# Patient Record
Sex: Female | Born: 1995 | Race: White | Hispanic: No | Marital: Single | Smoking: Never smoker
Health system: Southern US, Community
[De-identification: ages and names within clinical notes are randomized; demographics above are authoritative.]

## PROBLEM LIST (undated history)

## (undated) ENCOUNTER — Ambulatory Visit (HOSPITAL_COMMUNITY): Admission: EM | Disposition: A | Discharge status: Home / Self Care | Payer: BC Managed Care – PPO

## (undated) DIAGNOSIS — N390 Urinary tract infection, site not specified: Secondary | ICD-10-CM

## (undated) HISTORY — PX: NO PAST SURGERIES: SHX2092

---

## 2020-10-19 ENCOUNTER — Ambulatory Visit (HOSPITAL_COMMUNITY)
Admission: EM | Admit: 2020-10-19 | Discharge: 2020-10-19 | Disposition: A | Discharge status: Home / Self Care | Payer: BC Managed Care – PPO

## 2020-10-19 ENCOUNTER — Encounter (HOSPITAL_COMMUNITY): Payer: Self-pay | Admitting: Emergency Medicine

## 2020-10-19 ENCOUNTER — Other Ambulatory Visit: Payer: Self-pay

## 2020-10-19 DIAGNOSIS — J020 Streptococcal pharyngitis: Secondary | ICD-10-CM | POA: Diagnosis not present

## 2020-10-19 DIAGNOSIS — J0300 Acute streptococcal tonsillitis, unspecified: Secondary | ICD-10-CM | POA: Diagnosis not present

## 2020-10-19 LAB — POCT RAPID STREP A, ED / UC: Streptococcus, Group A Screen (Direct): POSITIVE — AB

## 2020-10-19 MED ORDER — DEXAMETHASONE SODIUM PHOSPHATE 10 MG/ML IJ SOLN
10.0000 mg | Freq: Once | INTRAMUSCULAR | Status: AC
  Administered 2020-10-19: 10 mg via INTRAMUSCULAR

## 2020-10-19 MED ORDER — AMOXICILLIN-POT CLAVULANATE 875-125 MG PO TABS: 1.0000 | ORAL_TABLET | Freq: Two times a day (BID) | ORAL | 0 refills | Status: DC

## 2020-10-19 MED ORDER — DEXAMETHASONE SODIUM PHOSPHATE 10 MG/ML IJ SOLN
INTRAMUSCULAR | Status: AC
  Filled 2020-10-19: qty 1

## 2020-10-19 MED ORDER — PREDNISONE 10 MG (21) PO TBPK: ORAL_TABLET | ORAL | 0 refills | Status: DC

## 2020-10-19 MED ORDER — LIDOCAINE VISCOUS HCL 2 % MT SOLN: 15.0000 mL | OROMUCOSAL | 0 refills | Status: DC | PRN

## 2020-10-19 MED ORDER — IBUPROFEN 600 MG PO TABS: 600.0000 mg | ORAL_TABLET | Freq: Four times a day (QID) | ORAL | 0 refills | Status: DC | PRN

## 2020-10-19 NOTE — ED Provider Notes (Signed)
[facility]    CSN: [id number] Arrival date & time: 10/19/20  2202      History   Chief Complaint Chief Complaint  Patient presents with  . Adenopathy  . Sore Throat    HPI Christy Thomas is a 25 y.o. female.   Subjective:   Christy Thomas is a 25 y.o. female who presents for evaluation of a sore throat. Associated symptoms include swollen glands, difficulty swallowing and a mild runny nose. She also has increased anxiety due to the pain experienced with swallowing. Onset of symptoms was about 1 month ago and has rapidly worsened since last night. She is drinking plenty of fluids and able to eat but with great discomfort. She has not had recent close exposure to someone with proven streptococcal pharyngitis. She has taken ibuprofen and hot tea/honey/lemon that has provided short term relief in symptoms. She denies any fevers, chills, congestion, cough, nausea or vomiting.   The following portions of the patient's history were reviewed and updated as appropriate: allergies, current medications, past family history, past medical history, past social history, past surgical history and problem list.  Pertinent items noted in HPI and remainder of comprehensive ROS otherwise negative.        History reviewed. No pertinent past medical history.  There are no problems to display for this patient.   History reviewed. No pertinent surgical history.  OB History   No obstetric history on file.      Home Medications    Prior to Admission medications   Medication Sig Start Date End Date Taking? Authorizing Provider  amoxicillin-clavulanate (AUGMENTIN) 875-125 MG tablet Take 1 tablet by mouth every 12 (twelve) hours. 10/19/20  Yes Lurline Idol, FNP  ibuprofen (ADVIL) 600 MG tablet Take 1 tablet (600 mg total) by mouth every 6 (six) hours as needed. 10/19/20  Yes Lurline Idol, FNP  lidocaine (XYLOCAINE) 2 % solution Use as directed 15 mLs in the mouth or  throat every 4 (four) hours as needed (Gargle every 4 hours as needed for sore throat). 10/19/20  Yes Lurline Idol, FNP  predniSONE (STERAPRED UNI-PAK 21 TAB) 10 MG (21) TBPK tablet Take as directed 10/19/20  Yes Quamere Mussell, FNP  AUROVELA 24 FE 1-20 MG-MCG(24) tablet Take 1 tablet by mouth daily. 09/02/20   [provider]    Family History Family History  Problem Relation Age of Onset  . Lymphoma Mother     Social History Social History   Tobacco Use  . Smoking status: Never Smoker  . Smokeless tobacco: Never Used  Substance Use Topics  . Alcohol use: Yes     Allergies   Patient has no known allergies.   Review of Systems Review of Systems  Constitutional: Negative for fever.  HENT: Positive for rhinorrhea, sore throat and trouble swallowing.   Eyes: Negative.   Respiratory: Negative.   Gastrointestinal: Negative.   Skin: Negative.   Neurological: Negative.   Psychiatric/Behavioral: The patient is nervous/anxious.   All other systems reviewed and are negative.    Physical Exam Triage Vital Signs ED Triage Vitals  Enc Vitals Group     BP 10/19/20 0925 110/66     Pulse Rate 10/19/20 0925 (!) 126     Resp 10/19/20 0925 17     Temp 10/19/20 0925 99.7 F (37.6 C)     Temp Source 10/19/20 0925 Oral     SpO2 10/19/20 0925 96 %     Weight --      Height --  Head Circumference --      Peak Flow --      Pain Score 10/19/20 0922 4     Pain Loc --      Pain Edu? --      Excl. in GC? --    No data found.  Updated Vital Signs BP 110/66 (BP Location: Left Arm)   Pulse (!) 126   Temp 99.7 F (37.6 C) (Oral)   Resp 17   LMP 09/28/2020   SpO2 96%   Visual Acuity Right Eye Distance:   Left Eye Distance:   Bilateral Distance:    Right Eye Near:   Left Eye Near:    Bilateral Near:     Physical Exam Vitals reviewed.  Constitutional:      General: She is not in acute distress.    Appearance: She is well-developed. She is not  ill-appearing or toxic-appearing.  HENT:     Head: Normocephalic.     Mouth/Throat:     Lips: Pink.     Mouth: Mucous membranes are moist.     Pharynx: Uvula midline. Pharyngeal swelling present. No oropharyngeal exudate, posterior oropharyngeal erythema or uvula swelling.     Tonsils: No tonsillar exudate or tonsillar abscesses. 3+ on the right. 3+ on the left.  Eyes:     Conjunctiva/sclera: Conjunctivae normal.  Neck:     Trachea: Trachea and phonation normal.  Cardiovascular:     Rate and Rhythm: Normal rate.     Heart sounds: Normal heart sounds.  Pulmonary:     Effort: Pulmonary effort is normal.     Breath sounds: Normal breath sounds. No stridor, decreased air movement or transmitted upper airway sounds.  Musculoskeletal:     Cervical back: Full passive range of motion without pain, normal range of motion and neck supple.  Lymphadenopathy:     Cervical: Cervical adenopathy present.  Skin:    General: Skin is warm and dry.  Neurological:     General: No focal deficit present.     Mental Status: She is alert and oriented to person, place, and time.  Psychiatric:        Mood and Affect: Mood normal.        Behavior: Behavior normal.      UC Treatments / Results  Labs (all labs ordered are listed, but only abnormal results are displayed) Labs Reviewed  POCT RAPID STREP A, ED / UC - Abnormal; Notable for the following components:      Result Value   Streptococcus, Group A Screen (Direct) POSITIVE (*)    All other components within normal limits    EKG   Radiology No results found.  Procedures Procedures (including critical care time)  Medications Ordered in UC Medications  dexamethasone (DECADRON) injection 10 mg (10 mg Intramuscular Given 10/19/20 0954)    Initial Impression / Assessment and Plan / UC Course  I have reviewed the triage vital signs and the nursing notes.  Pertinent labs & imaging results that were available during my care of the patient  were reviewed by me and considered in my medical decision making (see chart for details).    25 year old female presenting with sore throat, swollen glands, difficulty swallowing and increased anxiety due to these symptoms. Symptoms have been ongoing for the past month or so but worsening over the past 24 hours.  Patient is alert and oriented x3.  BP stable.  She is slightly tachycardic due to her anxiety.  Low-grade fever noted.  Physical  exam shows enlarged tonsils with tender and swollen lymph nodes.  Patient airway is intact.  She is able to maintain  oral secretions and speak in complete sentences.  No indications of acute distress noted.  Rapid strep positive. Will treat for both strep pharyngitis and tonsillitis.  Patient received IM Decadron in the clinic.   Plan:  Patient placed on antibiotics and steroids  Use of OTC analgesics recommended, salt water gargles and other somatic measures discussed Patient advised that he will be infectious for 24 hours after starting antibiotics. Change toothbrush after 48 hours no antibiotic Follow up as needed.  Today's evaluation has revealed no signs of a dangerous process. Discussed diagnosis with patient and/or guardian. Patient and/or guardian aware of their diagnosis, possible red flag symptoms to watch out for and need for close follow up. Patient and/or guardian understands verbal and written discharge instructions. Patient and/or guardian comfortable with plan and disposition.  Patient and/or guardian has a clear mental status at this time, good insight into illness (after discussion and teaching) and has clear judgment to make decisions regarding their care  This care was provided during an unprecedented National Emergency due to the Novel Coronavirus (COVID-19) pandemic. COVID-19 infections and transmission risks place heavy strains on healthcare resources.  As this pandemic evolves, our facility, providers, and staff strive to respond fluidly, to  remain operational, and to provide care relative to available resources and information. Outcomes are unpredictable and treatments are without well-defined guidelines. Further, the impact of COVID-19 on all aspects of urgent care, including the impact to patients seeking care for reasons other than COVID-19, is unavoidable during this national emergency. At this time of the global pandemic, management of patients has significantly changed, even for non-COVID positive patients given high local and regional COVID volumes at this time requiring high healthcare system and resource utilization. The standard of care for management of both COVID suspected and non-COVID suspected patients continues to change rapidly at the local, regional, national, and global levels. This patient was worked up and treated to the best available but ever changing evidence and resources available at this current time.   Documentation was completed with the aid of voice recognition software. Transcription may contain typographical errors.  Final Clinical Impressions(s) / UC Diagnoses   Final diagnoses:  Strep pharyngitis  Acute non-recurrent streptococcal tonsillitis     Discharge Instructions     . Take medications as prescribed . You may use the viscous lidocaine to gargle every 4 hours as needed for sore throat . Continue drinking warm liquids and eat soft foods until your throat starts to feel better . You may also take the Advil as needed for pain . Should change her toothbrush on Friday  I hope you feel better.  Take care. [facility]     ED Prescriptions    Medication Sig Dispense Auth. Provider   amoxicillin-clavulanate (AUGMENTIN) 875-125 MG tablet Take 1 tablet by mouth every 12 (twelve) hours. 14 tablet Marian Meneely, [place], FNP   predniSONE (STERAPRED UNI-PAK 21 TAB) 10 MG (21) TBPK tablet Take as directed 21 tablet Lorilyn Laitinen, FNP   lidocaine (XYLOCAINE) 2 % solution Use as directed 15 mLs in the  mouth or throat every 4 (four) hours as needed (Gargle every 4 hours as needed for sore throat). 120 mL Lurline IdolMurrill, Japneet Staggs, FNP   ibuprofen (ADVIL) 600 MG tablet Take 1 tablet (600 mg total) by mouth every 6 (six) hours as needed. 30 tablet Lurline IdolMurrill, Erie Radu, [place]NP  PDMP not reviewed this encounter.   Lurline Idol, [place] 10/19/20 1012

## 2020-10-19 NOTE — ED Triage Notes (Signed)
Pt presents with swollen lymph nodes xs 1 month. States last night swelling and pain was severe.   States has hx since child.

## 2020-10-19 NOTE — Discharge Instructions (Addendum)
Take medications as prescribed You may use the viscous lidocaine to gargle every 4 hours as needed for sore throat Continue drinking warm liquids and eat soft foods until your throat starts to feel better You may also take the Advil as needed for pain Should change her toothbrush on Friday  I hope you feel better.  Take care. Lelon Mast

## 2020-10-29 ENCOUNTER — Ambulatory Visit (HOSPITAL_COMMUNITY)
Admission: EM | Admit: 2020-10-29 | Discharge: 2020-10-29 | Disposition: A | Discharge status: Home / Self Care | Payer: BC Managed Care – PPO | Attending: Physician Assistant | Admitting: Physician Assistant

## 2020-10-29 ENCOUNTER — Encounter (HOSPITAL_COMMUNITY): Payer: Self-pay

## 2020-10-29 ENCOUNTER — Other Ambulatory Visit: Payer: Self-pay

## 2020-10-29 DIAGNOSIS — Z113 Encounter for screening for infections with a predominantly sexual mode of transmission: Secondary | ICD-10-CM | POA: Diagnosis not present

## 2020-10-29 DIAGNOSIS — N898 Other specified noninflammatory disorders of vagina: Secondary | ICD-10-CM | POA: Diagnosis present

## 2020-10-29 DIAGNOSIS — B373 Candidiasis of vulva and vagina: Secondary | ICD-10-CM | POA: Diagnosis not present

## 2020-10-29 DIAGNOSIS — N941 Unspecified dyspareunia: Secondary | ICD-10-CM | POA: Insufficient documentation

## 2020-10-29 DIAGNOSIS — B3731 Acute candidiasis of vulva and vagina: Secondary | ICD-10-CM

## 2020-10-29 MED ORDER — FLUCONAZOLE 150 MG PO TABS: ORAL_TABLET | ORAL | 0 refills | Status: DC

## 2020-10-29 NOTE — ED Triage Notes (Signed)
Pt in with c/o pain during intercourse, vaginal itching, irritation and dryness that she noticed 1 week ago and has gotten worse

## 2020-10-29 NOTE — Discharge Instructions (Signed)
We will be in touch with your results and contact you if we need to treat you for anything else.  I suspect you have a yeast infection and have called in Diflucan for you to take every 3 days for up to 3 doses.  Wear loosefitting cotton underwear.  Use hypoallergenic soaps and detergents.  If you have persistent or worsening symptoms please return for further evaluation.

## 2020-10-29 NOTE — ED Provider Notes (Signed)
[facility]    CSN: [id number] Arrival date & time: 10/29/20  1733      History   Chief Complaint Chief Complaint  Patient presents with  . Pain during intercourse  . vaginal irritation  . Vaginal Itching    HPI Christy Thomas is a 25 y.o. female.   Patient presents today with a 1 week history of dyspareunia.  She reports increased vaginal dryness and irritation.  She denies any vaginal discharge, vaginal pain, abnormal bleeding.  She has not tried any over-the-counter medications for symptom management.  She has no concern for STI but is interested in testing today.  She denies any changes to personal hygiene products including soaps, detergents, tampons, sanitary napkins.  She was seen approximately 1 week ago and treated for strep pharyngitis with antibiotics.  Reports symptoms began after antibiotic use.     History reviewed. No pertinent past medical history.  There are no problems to display for this patient.   History reviewed. No pertinent surgical history.  OB History   No obstetric history on file.      Home Medications    Prior to Admission medications   Medication Sig Start Date End Date Taking? Authorizing Provider  fluconazole (DIFLUCAN) 150 MG tablet Take 1 tablet every 72 hours for total of 3 doses or until symptoms resolve. 10/29/20  Yes Aedin Jeansonne K, PA-C  amoxicillin-clavulanate (AUGMENTIN) 875-125 MG tablet Take 1 tablet by mouth every 12 (twelve) hours. 10/19/20   Lurline Idol, FNP  AUROVELA 24 FE 1-20 MG-MCG(24) tablet Take 1 tablet by mouth daily. 09/02/20   [provider]  ibuprofen (ADVIL) 600 MG tablet Take 1 tablet (600 mg total) by mouth every 6 (six) hours as needed. 10/19/20   Lurline Idol, FNP  lidocaine (XYLOCAINE) 2 % solution Use as directed 15 mLs in the mouth or throat every 4 (four) hours as needed (Gargle every 4 hours as needed for sore throat). 10/19/20   Lurline Idol, FNP  predniSONE (STERAPRED  UNI-PAK 21 TAB) 10 MG (21) TBPK tablet Take as directed 10/19/20   Lurline Idol, FNP    Family History Family History  Problem Relation Age of Onset  . Lymphoma Mother     Social History Social History   Tobacco Use  . Smoking status: Never Smoker  . Smokeless tobacco: Never Used  Substance Use Topics  . Alcohol use: Yes     Allergies   Patient has no known allergies.   Review of Systems Review of Systems  Constitutional: Negative for activity change, appetite change, fatigue and fever.  Respiratory: Negative for cough and shortness of breath.   Gastrointestinal: Negative for abdominal pain, diarrhea, nausea and vomiting.  Genitourinary: Positive for dyspareunia. Negative for dysuria, genital sores, menstrual problem, pelvic pain, urgency, vaginal bleeding, vaginal discharge and vaginal pain.  Neurological: Negative for dizziness, light-headedness and headaches.     Physical Exam Triage Vital Signs ED Triage Vitals  Enc Vitals Group     BP 10/29/20 1756 126/81     Pulse Rate 10/29/20 1756 87     Resp 10/29/20 1756 18     Temp 10/29/20 1756 99.1 F (37.3 C)     Temp src --      SpO2 10/29/20 1756 97 %     Weight --      Height --      Head Circumference --      Peak Flow --      Pain Score 10/29/20 1755  0     Pain Loc --      Pain Edu? --      Excl. in GC? --    No data found.  Updated Vital Signs BP 126/81   Pulse 87   Temp 99.1 F (37.3 C)   Resp 18   LMP 10/21/2020 (Exact Date)   SpO2 97%   Visual Acuity Right Eye Distance:   Left Eye Distance:   Bilateral Distance:    Right Eye Near:   Left Eye Near:    Bilateral Near:     Physical Exam Vitals reviewed. Exam conducted with a chaperone present.  Constitutional:      General: She is awake. She is not in acute distress.    Appearance: Normal appearance. She is not ill-appearing.     Comments: Very pleasant female appears stated age in no acute distress  HENT:     Head: Normocephalic  and atraumatic.  Cardiovascular:     Rate and Rhythm: Normal rate and regular rhythm.     Heart sounds: No murmur heard.   Pulmonary:     Effort: Pulmonary effort is normal.     Breath sounds: Normal breath sounds. No wheezing, rhonchi or rales.     Comments: Clear to auscultation bilaterally Abdominal:     General: Bowel sounds are normal.     Palpations: Abdomen is soft.     Tenderness: There is no abdominal tenderness. There is no right CVA tenderness, left CVA tenderness, guarding or rebound.     Comments: Benign abdominal exam  Genitourinary:    Labia:        Right: Rash present. No tenderness.        Left: Rash present. No tenderness.      Vagina: Vaginal discharge present. No erythema.     Cervix: Normal.     Adnexa: Right adnexa normal and left adnexa normal.     Comments: Thick white discharge noted posterior vaginal vault.  No CMT tenderness on exam.  RN present as chaperone during exam. Psychiatric:        Behavior: Behavior is cooperative.      UC Treatments / Results  Labs (all labs ordered are listed, but only abnormal results are displayed) Labs Reviewed  CERVICOVAGINAL ANCILLARY ONLY    EKG   Radiology No results found.  Procedures Procedures (including critical care time)  Medications Ordered in UC Medications - No data to display  Initial Impression / Assessment and Plan / UC Course  I have reviewed the triage vital signs and the nursing notes.  Pertinent labs & imaging results that were available during my care of the patient were reviewed by me and considered in my medical decision making (see chart for details).     Physical exam consistent with vaginal yeast infection.  Patient was prescribed Diflucan to be used every 3 days for a total of up to 3 doses or until symptoms resolve.  Recommended cotton loosefitting underwear.  She is to use hypoallergenic soaps and detergents.  Strict return precautions given to which patient expressed  understanding.  Final Clinical Impressions(s) / UC Diagnoses   Final diagnoses:  Vaginal yeast infection     Discharge Instructions     We will be in touch with your results and contact you if we need to treat you for anything else.  I suspect you have a yeast infection and have called in Diflucan for you to take every 3 days for up to 3 doses.  Wear loosefitting cotton underwear.  Use hypoallergenic soaps and detergents.  If you have persistent or worsening symptoms please return for further evaluation.    ED Prescriptions    Medication Sig Dispense Auth. Provider   fluconazole (DIFLUCAN) 150 MG tablet Take 1 tablet every 72 hours for total of 3 doses or until symptoms resolve. 3 tablet Pennye Beeghly, Noberto Retort, PA-C     PDMP not reviewed this encounter.   Jeani Hawking, PA-C 10/29/20 1829

## 2020-11-01 LAB — CERVICOVAGINAL ANCILLARY ONLY
Bacterial Vaginitis (gardnerella): POSITIVE — AB
Candida Glabrata: NEGATIVE
Candida Vaginitis: POSITIVE — AB
Chlamydia: NEGATIVE
Comment: NEGATIVE
Comment: NEGATIVE
Comment: NEGATIVE
Comment: NEGATIVE
Comment: NEGATIVE
Comment: NORMAL
Neisseria Gonorrhea: NEGATIVE
Trichomonas: NEGATIVE

## 2020-11-03 ENCOUNTER — Telehealth (HOSPITAL_COMMUNITY): Payer: Self-pay | Admitting: Emergency Medicine

## 2020-11-03 MED ORDER — METRONIDAZOLE 500 MG PO TABS
500.0000 mg | ORAL_TABLET | Freq: Two times a day (BID) | ORAL | 0 refills | Status: DC
Start: 2020-11-03 — End: 2021-09-25

## 2020-11-04 ENCOUNTER — Telehealth (HOSPITAL_COMMUNITY): Payer: Self-pay | Admitting: Emergency Medicine

## 2020-12-13 ENCOUNTER — Ambulatory Visit (HOSPITAL_COMMUNITY)
Admission: EM | Admit: 2020-12-13 | Discharge: 2020-12-13 | Discharge status: Against Medical Advice | Payer: BC Managed Care – PPO | Attending: Internal Medicine | Admitting: Internal Medicine

## 2020-12-13 NOTE — ED Notes (Signed)
Pt left before being seen

## 2021-01-16 ENCOUNTER — Emergency Department (HOSPITAL_COMMUNITY)
Admission: EM | Admit: 2021-01-16 | Discharge: 2021-01-16 | Disposition: A | Discharge status: Home / Self Care | Payer: BC Managed Care – PPO | Attending: Emergency Medicine | Admitting: Emergency Medicine

## 2021-01-16 ENCOUNTER — Other Ambulatory Visit: Payer: Self-pay

## 2021-01-16 ENCOUNTER — Encounter (HOSPITAL_COMMUNITY): Payer: Self-pay | Admitting: Emergency Medicine

## 2021-01-16 DIAGNOSIS — R112 Nausea with vomiting, unspecified: Secondary | ICD-10-CM | POA: Diagnosis not present

## 2021-01-16 DIAGNOSIS — R1010 Upper abdominal pain, unspecified: Secondary | ICD-10-CM | POA: Diagnosis not present

## 2021-01-16 DIAGNOSIS — R1013 Epigastric pain: Secondary | ICD-10-CM | POA: Diagnosis not present

## 2021-01-16 LAB — COMPREHENSIVE METABOLIC PANEL WITH GFR
ALT: 27 U/L (ref 0–44)
AST: 30 U/L (ref 15–41)
Albumin: 4.5 g/dL (ref 3.5–5.0)
Alkaline Phosphatase: 56 U/L (ref 38–126)
Anion gap: 7 (ref 5–15)
BUN: 11 mg/dL (ref 6–20)
CO2: 25 mmol/L (ref 22–32)
Calcium: 9.4 mg/dL (ref 8.9–10.3)
Chloride: 105 mmol/L (ref 98–111)
Creatinine, Ser: 0.71 mg/dL (ref 0.44–1.00)
GFR, Estimated: >60 mL/min
Glucose, Bld: 90 mg/dL (ref 70–99)
Potassium: 4 mmol/L (ref 3.5–5.1)
Sodium: 137 mmol/L (ref 135–145)
Total Bilirubin: 0.4 mg/dL (ref 0.3–1.2)
Total Protein: 8.1 g/dL (ref 6.5–8.1)

## 2021-01-16 LAB — CBC WITH DIFFERENTIAL/PLATELET
Abs Immature Granulocytes: 0.04 K/uL (ref 0.00–0.07)
Basophils Absolute: 0 K/uL (ref 0.0–0.1)
Basophils Relative: 1 %
Eosinophils Absolute: 0 K/uL (ref 0.0–0.5)
Eosinophils Relative: 0 %
HCT: 42.1 % (ref 36.0–46.0)
Hemoglobin: 13.9 g/dL (ref 12.0–15.0)
Immature Granulocytes: 1 %
Lymphocytes Relative: 10 %
Lymphs Abs: 0.7 K/uL (ref 0.7–4.0)
MCH: 28.8 pg (ref 26.0–34.0)
MCHC: 33 g/dL (ref 30.0–36.0)
MCV: 87.2 fL (ref 80.0–100.0)
Monocytes Absolute: 0.2 K/uL (ref 0.1–1.0)
Monocytes Relative: 3 %
Neutro Abs: 6.3 K/uL (ref 1.7–7.7)
Neutrophils Relative %: 85 %
Platelets: 337 K/uL (ref 150–400)
RBC: 4.83 MIL/uL (ref 3.87–5.11)
RDW: 12.9 % (ref 11.5–15.5)
WBC: 7.4 K/uL (ref 4.0–10.5)
nRBC: 0 % (ref 0.0–0.2)

## 2021-01-16 LAB — MAGNESIUM: Magnesium: 2.1 mg/dL (ref 1.7–2.4)

## 2021-01-16 LAB — I-STAT BETA HCG BLOOD, ED (MC, WL, AP ONLY): I-stat hCG, quantitative: <5 m[IU]/mL

## 2021-01-16 LAB — LIPASE, BLOOD: Lipase: 24 U/L (ref 11–51)

## 2021-01-16 MED ORDER — ONDANSETRON HCL 4 MG/2ML IJ SOLN
4.0000 mg | Freq: Once | INTRAMUSCULAR | Status: AC
  Administered 2021-01-16: 4 mg via INTRAVENOUS
  Filled 2021-01-16: qty 2

## 2021-01-16 MED ORDER — SODIUM CHLORIDE 0.9 % IV BOLUS
1000.0000 mL | Freq: Once | INTRAVENOUS | Status: AC
  Administered 2021-01-16: 1000 mL via INTRAVENOUS

## 2021-01-16 MED ORDER — ONDANSETRON 4 MG PO TBDP: 4.0000 mg | ORAL_TABLET | Freq: Three times a day (TID) | ORAL | 0 refills | Status: DC | PRN

## 2021-01-16 NOTE — Discharge Instructions (Addendum)
I am prescribing you a medication called Zofran.  This is a disintegrating tablet you can use up to 3 times a day for management of your nausea and vomiting.  Please only take this as prescribed.  Please only take this if you are experiencing nausea and vomiting that you cannot control.  Please come back to the emergency department if you develop any new or worsening symptoms.  It was a pleasure to meet you.

## 2021-01-16 NOTE — ED Triage Notes (Signed)
Patient states she drank too much last night and she thinks she has alcohol poisoning. Has 1.5 glasses or red wine and ' quite a few' shots of tequila. Has been vomiting ever since.

## 2021-01-16 NOTE — ED Provider Notes (Signed)
[facility]-EMERGENCY DEPT Provider Note   CSN: [id number] Arrival date & time: 01/16/21  2979     History No chief complaint on file.   Christy Thomas is a 25 y.o. female.  HPI Patient is a 25 year old female with no pertinent medical history presents to the emergency department due to intractable nausea and vomiting.  Patient states that she typically does not drink alcohol but last night had 1.5 glasses of red wine and about 3-4 shots of tequila.  Since then she has been experiencing intractable nausea/vomiting that she states is clear.  Reports mild upper abdominal pain.  No chest pain or shortness of breath.  No other complaints.    History reviewed. No pertinent past medical history.  There are no problems to display for this patient.   History reviewed. No pertinent surgical history.   OB History   No obstetric history on file.     Family History  Problem Relation Age of Onset   Lymphoma Mother     Social History   Tobacco Use   Smoking status: Never   Smokeless tobacco: Never  Substance Use Topics   Alcohol use: Yes    Home Medications Prior to Admission medications   Medication Sig Start Date End Date Taking? Authorizing Provider  ondansetron (ZOFRAN ODT) 4 MG disintegrating tablet Take 1 tablet (4 mg total) by mouth every 8 (eight) hours as needed for nausea or vomiting. 01/16/21  Yes Placido Sou, PA-C  amoxicillin-clavulanate (AUGMENTIN) 875-125 MG tablet Take 1 tablet by mouth every 12 (twelve) hours. 10/19/20   Lurline Idol, FNP  AUROVELA 24 FE 1-20 MG-MCG(24) tablet Take 1 tablet by mouth daily. 09/02/20   [provider]  fluconazole (DIFLUCAN) 150 MG tablet Take 1 tablet every 72 hours for total of 3 doses or until symptoms resolve. 10/29/20   Raspet, Noberto Retort, PA-C  ibuprofen (ADVIL) 600 MG tablet Take 1 tablet (600 mg total) by mouth every 6 (six) hours as needed. 10/19/20   Lurline Idol, FNP  lidocaine  (XYLOCAINE) 2 % solution Use as directed 15 mLs in the mouth or throat every 4 (four) hours as needed (Gargle every 4 hours as needed for sore throat). 10/19/20   Lurline Idol, FNP  metroNIDAZOLE (FLAGYL) 500 MG tablet Take 1 tablet (500 mg total) by mouth 2 (two) times daily. 11/03/20   Merrilee Jansky, MD  predniSONE (STERAPRED UNI-PAK 21 TAB) 10 MG (21) TBPK tablet Take as directed 10/19/20   Lurline Idol, FNP    Allergies    Patient has no known allergies.  Review of Systems   Review of Systems  Constitutional:  Negative for fever.  Respiratory:  Negative for shortness of breath.   Cardiovascular:  Negative for chest pain.  Gastrointestinal:  Positive for abdominal pain, nausea and vomiting.   Physical Exam Updated Vital Signs BP 104/62 (BP Location: Left Arm)   Pulse 85   Temp 98.5 F (36.9 C) (Oral)   Resp 16   Ht 5\' 1"  (1.549 m)   Wt 63.5 kg   LMP 01/15/2021   SpO2 99%   BMI 26.45 kg/m   Physical Exam Vitals and nursing note reviewed.  Constitutional:      General: She is not in acute distress.    Appearance: Normal appearance. She is normal weight. She is not ill-appearing, toxic-appearing or diaphoretic.  HENT:     Head: Normocephalic and atraumatic.     Right Ear: External ear normal.  Left Ear: External ear normal.     Nose: Nose normal.     Mouth/Throat:     Mouth: Mucous membranes are moist.     Pharynx: Oropharynx is clear. No oropharyngeal exudate or posterior oropharyngeal erythema.  Eyes:     Extraocular Movements: Extraocular movements intact.  Cardiovascular:     Rate and Rhythm: Normal rate and regular rhythm.     Pulses: Normal pulses.     Heart sounds: Normal heart sounds. No murmur heard.   No friction rub. No gallop.  Pulmonary:     Effort: Pulmonary effort is normal. No respiratory distress.     Breath sounds: Normal breath sounds. No stridor. No wheezing, rhonchi or rales.  Abdominal:     General: Abdomen is flat.      Palpations: Abdomen is soft.     Tenderness: There is abdominal tenderness.     Comments: Abdomen is flat and soft.  Very mild tenderness noted along the epigastrium.  Musculoskeletal:        General: Normal range of motion.     Cervical back: Normal range of motion and neck supple. No tenderness.  Skin:    General: Skin is warm and dry.  Neurological:     General: No focal deficit present.     Mental Status: She is alert and oriented to person, place, and time.  Psychiatric:        Mood and Affect: Mood normal.        Behavior: Behavior normal.   ED Results / Procedures / Treatments   Labs (all labs ordered are listed, but only abnormal results are displayed) Labs Reviewed  COMPREHENSIVE METABOLIC PANEL  CBC WITH DIFFERENTIAL/PLATELET  LIPASE, BLOOD  MAGNESIUM  I-STAT BETA HCG BLOOD, ED ([facility], [facility], [facility] ONLY)    EKG None  Radiology No results found.  Procedures Procedures   Medications Ordered in ED Medications  sodium chloride 0.9 % bolus 1,000 mL (1,000 mLs Intravenous New Bag/Given 01/16/21 1133)  ondansetron (ZOFRAN) injection 4 mg (4 mg Intravenous Given 01/16/21 1133)    ED Course  I have reviewed the triage vital signs and the nursing notes.  Pertinent labs & imaging results that were available during my care of the patient were reviewed by me and considered in my medical decision making (see chart for details).    MDM Rules/Calculators/A&P                          Pt is a 25 y.o. female who presents to the ED due to intractable n/v after drinking ETOH last night.  Labs: CBC, CMP, lipase, magnesium, i-STAT beta-hCG all within normal limits.  I, Placido Sou, PA-C, personally reviewed and evaluated these images and lab results as part of my medical decision-making.  Patient reassessed and feeling better after Zofran as well as IV fluids.  Lab work is all reassuring.  P.o. challenged successfully.  Feel the patient is stable for discharge at this time and  she is agreeable.  Discussed moderation of her alcohol use in the future.  Will discharge with an additional course of Zofran for use as needed.  Discussed return precautions.  Her questions were answered and she was amicable at the time of discharge.  Note: Portions of this report may have been transcribed using voice recognition software. Every effort was made to ensure accuracy; however, inadvertent computerized transcription errors may be present.   Final Clinical Impression(s) / ED Diagnoses Final  diagnoses:  Non-intractable vomiting with nausea, unspecified vomiting type    Rx / DC Orders ED Discharge Orders          Ordered    ondansetron (ZOFRAN ODT) 4 MG disintegrating tablet  Every 8 hours PRN        01/16/21 1242             Placido Sou, PA-C 01/16/21 1244    Bethann Berkshire, MD 01/16/21 [phone]

## 2021-08-26 ENCOUNTER — Ambulatory Visit (HOSPITAL_COMMUNITY)
Admission: EM | Admit: 2021-08-26 | Discharge: 2021-08-26 | Disposition: A | Discharge status: Home / Self Care | Payer: BC Managed Care – PPO | Attending: Urgent Care | Admitting: Urgent Care

## 2021-08-26 ENCOUNTER — Encounter (HOSPITAL_COMMUNITY): Payer: Self-pay

## 2021-08-26 DIAGNOSIS — J039 Acute tonsillitis, unspecified: Secondary | ICD-10-CM | POA: Diagnosis not present

## 2021-08-26 DIAGNOSIS — Z202 Contact with and (suspected) exposure to infections with a predominantly sexual mode of transmission: Secondary | ICD-10-CM | POA: Diagnosis not present

## 2021-08-26 LAB — POCT INFECTIOUS MONO SCREEN, ED / UC: Mono Screen: NEGATIVE

## 2021-08-26 LAB — POCT RAPID STREP A, ED / UC: Streptococcus, Group A Screen (Direct): NEGATIVE

## 2021-08-26 MED ORDER — AZITHROMYCIN 250 MG PO TABS: 250.0000 mg | ORAL_TABLET | Freq: Every day | ORAL | 0 refills | Status: DC

## 2021-08-26 NOTE — ED Triage Notes (Signed)
Pt reports sore throat and swollen glands x 2 days, She report giving oral sex to one of her partners who now has tested positive for chlamydia. She is concerned she may have chlamydia in her mouth.

## 2021-08-26 NOTE — Discharge Instructions (Addendum)
Your rapid strep was negative, however clinically you meet criteria for treatment. Your mono test was also negative. We will send out a strep culture as well. We will call with the results of the gonorrhea and chlamydia swab once received. Start treatment with azithromycin today - take two pills today, followed by one pill each day thereafter until gone. Throw away your toothbrush on day three of antibiotics and get a new one.

## 2021-08-26 NOTE — ED Provider Notes (Signed)
[facility]    CSN: [id number] Arrival date & time: 08/26/21  1101      History   Chief Complaint Chief Complaint  Patient presents with   Sore Throat    HPI Christy Thomas is a 26 y.o. female.   Pleasant 26 year old female presents today with concerns of a sore throat.  She states for the past 2 days she has had severe pain and swelling, fever of 101.2, and swollen lymph nodes.  She states her most recent partner just notified her that he tested positive for chlamydia, however her last sexual encounter with him was 1.5 months ago.  She does state however, that her throat has "not felt right since".  Since having oral sex with this partner, she states she has felt like "something has been stuck in her throat", but the pain with swallowing and fever did not start until 2 days ago.  She denies any pelvic symptoms, denies any pain, dysuria, discharge.  She denies any rash.  She took over-the-counter Tylenol and ibuprofen for symptoms, apart from that no other over-the-counter's. She denies dysphagia, headache, rash or abdominal symptoms.   Sore Throat   History reviewed. No pertinent past medical history.  There are no problems to display for this patient.   History reviewed. No pertinent surgical history.  OB History   No obstetric history on file.      Home Medications    Prior to Admission medications   Medication Sig Start Date End Date Taking? Authorizing Provider  azithromycin (ZITHROMAX) 250 MG tablet Take 1 tablet (250 mg total) by mouth daily. Take first 2 tablets together, then 1 every day until finished. 08/26/21  Yes Besse Miron L, PA  AUROVELA 24 FE 1-20 MG-MCG(24) tablet Take 1 tablet by mouth daily. 09/02/20   [provider]  fluconazole (DIFLUCAN) 150 MG tablet Take 1 tablet every 72 hours for total of 3 doses or until symptoms resolve. 10/29/20   Raspet, Noberto Retort, PA-C  ibuprofen (ADVIL) 600 MG tablet Take 1 tablet (600 mg total) by mouth  every 6 (six) hours as needed. 10/19/20   Lurline Idol, FNP  lidocaine (XYLOCAINE) 2 % solution Use as directed 15 mLs in the mouth or throat every 4 (four) hours as needed (Gargle every 4 hours as needed for sore throat). 10/19/20   Lurline Idol, FNP  metroNIDAZOLE (FLAGYL) 500 MG tablet Take 1 tablet (500 mg total) by mouth 2 (two) times daily. 11/03/20   Merrilee Jansky, MD  ondansetron (ZOFRAN ODT) 4 MG disintegrating tablet Take 1 tablet (4 mg total) by mouth every 8 (eight) hours as needed for nausea or vomiting. 01/16/21   Placido Sou, PA-C    Family History Family History  Problem Relation Age of Onset   Lymphoma Mother     Social History Social History   Tobacco Use   Smoking status: Never   Smokeless tobacco: Never  Substance Use Topics   Alcohol use: Yes     Allergies   Patient has no known allergies.   Review of Systems Review of Systems  Constitutional:  Positive for fever.  HENT:  Positive for sore throat.   Hematological:  Positive for adenopathy.  All other systems reviewed and are negative.   Physical Exam Triage Vital Signs ED Triage Vitals  Enc Vitals Group     BP 08/26/21 1146 116/77     Pulse --      Resp 08/26/21 1146 16     Temp  08/26/21 1150 99 F (37.2 C)     Temp src --      SpO2 08/26/21 1146 100 %     Weight --      Height --      Head Circumference --      Peak Flow --      Pain Score 08/26/21 1147 7     Pain Loc --      Pain Edu? --      Excl. in GC? --    No data found.  Updated Vital Signs BP 116/77 (BP Location: Left Arm)    Temp 99 F (37.2 C)    Resp 16    LMP 08/26/2021 (Approximate)    SpO2 100%   Visual Acuity Right Eye Distance:   Left Eye Distance:   Bilateral Distance:    Right Eye Near:   Left Eye Near:    Bilateral Near:     Physical Exam Vitals and nursing note reviewed.  Constitutional:      General: She is not in acute distress.    Appearance: She is well-developed and normal weight.  She is ill-appearing. She is not toxic-appearing or diaphoretic.  HENT:     Head: Normocephalic and atraumatic.     Right Ear: Tympanic membrane and ear canal normal. No drainage, swelling or tenderness. No middle ear effusion. Tympanic membrane is not erythematous.     Left Ear: Tympanic membrane and ear canal normal. No drainage, swelling or tenderness.  No middle ear effusion. Tympanic membrane is not erythematous.     Nose: No congestion or rhinorrhea.     Mouth/Throat:     Mouth: Mucous membranes are moist.     Pharynx: Oropharyngeal exudate, posterior oropharyngeal erythema and uvula swelling present. No pharyngeal swelling.     Tonsils: Tonsillar exudate present. No tonsillar abscesses. 3+ on the right. 3+ on the left.     Comments: Petechiae to soft palate midline Eyes:     Extraocular Movements:     Right eye: Normal extraocular motion.     Left eye: Normal extraocular motion.     Conjunctiva/sclera: Conjunctivae normal.     Pupils: Pupils are equal, round, and reactive to light.  Neck:     Thyroid: No thyromegaly.  Cardiovascular:     Rate and Rhythm: Regular rhythm. Tachycardia present.     Heart sounds: Normal heart sounds. No murmur heard.   No friction rub. No gallop.  Pulmonary:     Effort: Pulmonary effort is normal. No respiratory distress.     Breath sounds: Normal breath sounds. No stridor. No wheezing, rhonchi or rales.  Chest:     Chest wall: No tenderness.  Abdominal:     General: There is no distension.     Palpations: Abdomen is soft.     Tenderness: There is no abdominal tenderness. There is no rebound.  Musculoskeletal:     Cervical back: Normal range of motion and neck supple.  Lymphadenopathy:     Cervical: No cervical adenopathy (bilateral submandibular, left anterior cervical).  Skin:    General: Skin is warm.     Capillary Refill: Capillary refill takes less than 2 seconds.  Neurological:     General: No focal deficit present.     Mental Status:  She is alert and oriented to person, place, and time.  Psychiatric:        Mood and Affect: Mood normal.     UC Treatments / Results  Labs (all labs  ordered are listed, but only abnormal results are displayed) Labs Reviewed  POCT RAPID STREP A, ED / UC  POCT INFECTIOUS MONO SCREEN, ED / UC  CERVICOVAGINAL ANCILLARY ONLY  CYTOLOGY, (ORAL, ANAL, URETHRAL) ANCILLARY ONLY    EKG   Radiology No results found.  Procedures Procedures (including critical care time)  Medications Ordered in UC Medications - No data to display  Initial Impression / Assessment and Plan / UC Course  I have reviewed the triage vital signs and the nursing notes.  Pertinent labs & imaging results that were available during my care of the patient were reviewed by me and considered in my medical decision making (see chart for details).     Tonsillitis/ suspected strep - pt very concerned about chlamydia of her throat. Due to this, will send pt home with Zpack to cover for suspected bacterial pathogens. Mono in office negative, culture sent out.   Final Clinical Impressions(s) / UC Diagnoses   Final diagnoses:  Exposure to chlamydia  Tonsillitis     Discharge Instructions      Your rapid strep was negative, however clinically you meet criteria for treatment. Your mono test was also negative. We will send out a strep culture as well. We will call with the results of the gonorrhea and chlamydia swab once received. Start treatment with azithromycin today - take two pills today, followed by one pill each day thereafter until gone. Throw away your toothbrush on day three of antibiotics and get a new one.     ED Prescriptions     Medication Sig Dispense Auth. Provider   azithromycin (ZITHROMAX) 250 MG tablet Take 1 tablet (250 mg total) by mouth daily. Take first 2 tablets together, then 1 every day until finished. 6 tablet Kaveri Perras L, [place]      PDMP not reviewed this encounter.   Maretta Bees, [place] 08/26/21 1333

## 2021-08-29 LAB — CULTURE, GROUP A STREP (THRC)

## 2021-08-29 LAB — CYTOLOGY, (ORAL, ANAL, URETHRAL) ANCILLARY ONLY
Chlamydia: NEGATIVE
Comment: NEGATIVE
Comment: NORMAL
Neisseria Gonorrhea: NEGATIVE

## 2021-09-25 ENCOUNTER — Ambulatory Visit (INDEPENDENT_AMBULATORY_CARE_PROVIDER_SITE_OTHER)
Admission: EM | Admit: 2021-09-25 | Discharge: 2021-09-25 | Disposition: A | Discharge status: Home / Self Care | Payer: BC Managed Care – PPO | Source: Home / Self Care

## 2021-09-25 ENCOUNTER — Encounter (HOSPITAL_COMMUNITY): Payer: Self-pay | Admitting: Emergency Medicine

## 2021-09-25 DIAGNOSIS — N12 Tubulo-interstitial nephritis, not specified as acute or chronic: Secondary | ICD-10-CM | POA: Diagnosis not present

## 2021-09-25 DIAGNOSIS — N3000 Acute cystitis without hematuria: Secondary | ICD-10-CM

## 2021-09-25 DIAGNOSIS — N10 Acute tubulo-interstitial nephritis: Secondary | ICD-10-CM | POA: Diagnosis not present

## 2021-09-25 LAB — POCT URINALYSIS DIPSTICK, ED / UC
Bilirubin Urine: NEGATIVE
Glucose, UA: NEGATIVE mg/dL
Ketones, ur: NEGATIVE mg/dL
Nitrite: POSITIVE — AB
Protein, ur: 30 mg/dL — AB
Specific Gravity, Urine: 1.02 (ref 1.005–1.030)
Urobilinogen, UA: 0.2 mg/dL (ref 0.0–1.0)
pH: 5.5 (ref 5.0–8.0)

## 2021-09-25 MED ORDER — NITROFURANTOIN MONOHYD MACRO 100 MG PO CAPS: 100.0000 mg | ORAL_CAPSULE | Freq: Two times a day (BID) | ORAL | 0 refills | Status: DC

## 2021-09-25 NOTE — Discharge Instructions (Signed)
Urinalysis positive for Christy Thomas blood cells and nitrates which is indicative of infection, your urine has been sent to the lab to determine which bacteria is present, if any changes need to be made to your medication you will be notified via telephone any medication sent to pharmacy ? ?Begin use of Macrobid twice daily for the next 5 days ? ?You may continue use of ibuprofen if helpful for pain, you may also attempt use of over-the-counter Azo, be mindful this medication will will turn your urine orange, this is not harmful ? ?As always increase your fluid intake through use of water to further help flush your bladder ? ?As always practice good feminine hygiene wiping from front to back, urinating after sexual encounters and using unscented vaginal products ? ?You have been given a referral to urology due to your reoccurring infections for further management and follow-up ? ?Sti Labs pending 2-3 days, you will be contacted if positive for any sti and treatment will be sent to the pharmacy, you will have to return to the clinic if positive for gonorrhea to receive treatment  ? ?Please refrain from having sex until labs results, if positive please refrain from having sex until treatment complete and symptoms resolve  ? ?If positive for  Chlamydia  gonorrhea or trichomoniasis please notify partner or partners so they may tested as well ? ?Moving forward, it is recommended you use some form of protection against the transmission of sti infections  such as condoms or dental dams with each sexual encounter  ? ? ? ?

## 2021-09-25 NOTE — ED Provider Notes (Signed)
?[facility] ? ? ? ?CSN: [id number] ?Arrival date & time: 09/25/21  1010 ? ? ?  ? ?History   ?Chief Complaint ?Chief Complaint  ?Patient presents with  ? Dysuria  ? ? ?HPI ?Christy Thomas is a 26 y.o. female.  ? ?Patient presents with urinary frequency, dysuria and right-sided lower abdominal cramping, right-sided flank pain, cloudy urine and mild vaginal itching for 1 week.  Has been taking ibuprofen for pain management which has been effective.  Endorses that she typically will get 4-5 urinary tract infections throughout the year usually not requiring medical interference.  Denies hematuria, urgency, vaginal discharge, vaginal odor, urinary odor. ? ?History reviewed. No pertinent past medical history. ? ?There are no problems to display for this patient. ? ? ?History reviewed. No pertinent surgical history. ? ?OB History   ?No obstetric history on file. ?  ? ? ? ?Home Medications   ? ?Prior to Admission medications   ?Medication Sig Start Date End Date Taking? Authorizing Provider  ?AUROVELA 24 FE 1-20 MG-MCG(24) tablet Take 1 tablet by mouth daily. 09/02/20   [provider]  ?azithromycin (ZITHROMAX) 250 MG tablet Take 1 tablet (250 mg total) by mouth daily. Take first 2 tablets together, then 1 every day until finished. 08/26/21   Crain, Whitney L, PA  ?fluconazole (DIFLUCAN) 150 MG tablet Take 1 tablet every 72 hours for total of 3 doses or until symptoms resolve. 10/29/20   Raspet, Noberto Retort, PA-C  ?ibuprofen (ADVIL) 600 MG tablet Take 1 tablet (600 mg total) by mouth every 6 (six) hours as needed. 10/19/20   Lurline Idol, FNP  ?lidocaine (XYLOCAINE) 2 % solution Use as directed 15 mLs in the mouth or throat every 4 (four) hours as needed (Gargle every 4 hours as needed for sore throat). 10/19/20   Lurline Idol, FNP  ?metroNIDAZOLE (FLAGYL) 500 MG tablet Take 1 tablet (500 mg total) by mouth 2 (two) times daily. 11/03/20   Merrilee Jansky, MD  ?ondansetron (ZOFRAN ODT) 4 MG  disintegrating tablet Take 1 tablet (4 mg total) by mouth every 8 (eight) hours as needed for nausea or vomiting. 01/16/21   Placido Sou, PA-C  ? ? ?Family History ?Family History  ?Problem Relation Age of Onset  ? Lymphoma Mother   ? ? ?Social History ?Social History  ? ?Tobacco Use  ? Smoking status: Never  ? Smokeless tobacco: Never  ?Substance Use Topics  ? Alcohol use: Yes  ? ? ? ?Allergies   ?Patient has no known allergies. ? ? ?Review of Systems ?Review of Systems  ?Constitutional: Negative.   ?Cardiovascular: Negative.   ?Genitourinary:  Positive for dysuria and frequency. Negative for decreased urine volume, difficulty urinating, dyspareunia, enuresis, flank pain, genital sores, hematuria, menstrual problem, pelvic pain, urgency, vaginal bleeding, vaginal discharge and vaginal pain.  ?Skin: Negative.   ? ? ?Physical Exam ?Triage Vital Signs ?ED Triage Vitals  ?Enc Vitals Group  ?   BP 09/25/21 1041 (!) 105/59  ?   Pulse Rate 09/25/21 1041 98  ?   Resp 09/25/21 1041 17  ?   Temp 09/25/21 1041 99 ?F (37.2 ?C)  ?   Temp Source 09/25/21 1041 Oral  ?   SpO2 09/25/21 1041 99 %  ?   Weight --   ?   Height --   ?   Head Circumference --   ?   Peak Flow --   ?   Pain Score 09/25/21 1039 9  ?  Pain Loc --   ?   Pain Edu? --   ?   Excl. in GC? --   ? ?No data found. ? ?Updated Vital Signs ?BP (!) 105/59 (BP Location: Left Arm)   Pulse 98   Temp 99 ?F (37.2 ?C) (Oral)   Resp 17   LMP 09/25/2021 (Approximate)   SpO2 99%  ? ?Visual Acuity ?Right Eye Distance:   ?Left Eye Distance:   ?Bilateral Distance:   ? ?Right Eye Near:   ?Left Eye Near:    ?Bilateral Near:    ? ?Physical Exam ?Constitutional:   ?   Appearance: Normal appearance.  ?HENT:  ?   Head: Normocephalic.  ?Pulmonary:  ?   Effort: Pulmonary effort is normal.  ?Abdominal:  ?   General: Abdomen is flat. Bowel sounds are normal. There is no distension.  ?   Palpations: Abdomen is soft.  ?   Tenderness: There is no abdominal tenderness. There is right  CVA tenderness. There is no left CVA tenderness.  ?Genitourinary: ?   Comments: Deferred.  ?Skin: ?   General: Skin is warm and dry.  ?Neurological:  ?   Mental Status: She is alert and oriented to person, place, and time. Mental status is at baseline.  ?Psychiatric:     ?   Mood and Affect: Mood normal.     ?   Behavior: Behavior normal.  ? ? ? ?UC Treatments / Results  ?Labs ?(all labs ordered are listed, but only abnormal results are displayed) ?Labs Reviewed  ?POCT URINALYSIS DIPSTICK, ED / UC - Abnormal; Notable for the following components:  ?    Result Value  ? Hgb urine dipstick MODERATE (*)   ? Protein, ur 30 (*)   ? Nitrite POSITIVE (*)   ? Leukocytes,Ua LARGE (*)   ? All other components within normal limits  ? ? ?EKG ? ? ?Radiology ?No results found. ? ?Procedures ?Procedures (including critical care time) ? ?Medications Ordered in UC ?Medications - No data to display ? ?Initial Impression / Assessment and Plan / UC Course  ?I have reviewed the triage vital signs and the nursing notes. ? ?Pertinent labs & imaging results that were available during my care of the patient were reviewed by me and considered in my medical decision making (see chart for details). ? ?Acute cystitis without hematuria ? ?Urinalysis positive for hemoglobin, Marilene Vath blood cells and nitrates, discussed findings with patient, Macrobid 5-day course prescribed, urine sent for culture, advised increase fluid intake through use of water and good feminine hygiene, may continue use of ibuprofen for pain management, may also try over-the-counter Azo, given walking referral to urology as patient endorses frequent UTIs ?Final Clinical Impressions(s) / UC Diagnoses  ? ?Final diagnoses:  ?None  ? ?Discharge Instructions   ?None ?  ? ?ED Prescriptions   ?None ?  ? ?PDMP not reviewed this encounter. ?  ?Valinda Hoar, NP ?09/25/21 1145 ? ?

## 2021-09-25 NOTE — ED Triage Notes (Signed)
Pt reports for a week been having urinary frequency and dysuria. Reports that normally her UTIs will go away on their own, but this time causing abd pains and symptoms getting worse.  ?

## 2021-09-26 LAB — CERVICOVAGINAL ANCILLARY ONLY
Bacterial Vaginitis (gardnerella): NEGATIVE
Candida Glabrata: NEGATIVE
Candida Vaginitis: NEGATIVE
Chlamydia: NEGATIVE
Comment: NEGATIVE
Comment: NEGATIVE
Comment: NEGATIVE
Comment: NEGATIVE
Comment: NEGATIVE
Comment: NORMAL
Neisseria Gonorrhea: NEGATIVE
Trichomonas: NEGATIVE

## 2021-09-27 ENCOUNTER — Emergency Department (HOSPITAL_COMMUNITY)
Admission: EM | Admit: 2021-09-27 | Discharge: 2021-09-27 | Disposition: A | Discharge status: Home / Self Care | Payer: BC Managed Care – PPO | Source: Home / Self Care | Attending: Emergency Medicine | Admitting: Emergency Medicine

## 2021-09-27 ENCOUNTER — Encounter (HOSPITAL_COMMUNITY): Payer: Self-pay

## 2021-09-27 ENCOUNTER — Emergency Department (HOSPITAL_COMMUNITY): Payer: BC Managed Care – PPO

## 2021-09-27 ENCOUNTER — Other Ambulatory Visit: Payer: Self-pay

## 2021-09-27 DIAGNOSIS — R824 Acetonuria: Secondary | ICD-10-CM | POA: Insufficient documentation

## 2021-09-27 DIAGNOSIS — E876 Hypokalemia: Secondary | ICD-10-CM | POA: Insufficient documentation

## 2021-09-27 DIAGNOSIS — E871 Hypo-osmolality and hyponatremia: Secondary | ICD-10-CM | POA: Insufficient documentation

## 2021-09-27 DIAGNOSIS — N10 Acute tubulo-interstitial nephritis: Secondary | ICD-10-CM | POA: Insufficient documentation

## 2021-09-27 DIAGNOSIS — D72829 Elevated white blood cell count, unspecified: Secondary | ICD-10-CM | POA: Insufficient documentation

## 2021-09-27 LAB — CBC WITH DIFFERENTIAL/PLATELET
Abs Immature Granulocytes: 0.09 10*3/uL — ABNORMAL HIGH (ref 0.00–0.07)
Basophils Absolute: 0.1 10*3/uL (ref 0.0–0.1)
Basophils Relative: 0 %
Eosinophils Absolute: 0 10*3/uL (ref 0.0–0.5)
Eosinophils Relative: 0 %
HCT: 38.4 % (ref 36.0–46.0)
Hemoglobin: 12.7 g/dL (ref 12.0–15.0)
Immature Granulocytes: 1 %
Lymphocytes Relative: 4 %
Lymphs Abs: 0.7 10*3/uL (ref 0.7–4.0)
MCH: 29.7 pg (ref 26.0–34.0)
MCHC: 33.1 g/dL (ref 30.0–36.0)
MCV: 89.9 fL (ref 80.0–100.0)
Monocytes Absolute: 1.8 10*3/uL — ABNORMAL HIGH (ref 0.1–1.0)
Monocytes Relative: 12 %
Neutro Abs: 13 10*3/uL — ABNORMAL HIGH (ref 1.7–7.7)
Neutrophils Relative %: 83 %
Platelets: 238 10*3/uL (ref 150–400)
RBC: 4.27 MIL/uL (ref 3.87–5.11)
RDW: 14 % (ref 11.5–15.5)
WBC: 15.7 10*3/uL — ABNORMAL HIGH (ref 4.0–10.5)
nRBC: 0 % (ref 0.0–0.2)

## 2021-09-27 LAB — URINALYSIS, ROUTINE W REFLEX MICROSCOPIC
Bilirubin Urine: NEGATIVE
Glucose, UA: NEGATIVE mg/dL
Ketones, ur: 80 mg/dL — AB
Nitrite: NEGATIVE
Protein, ur: 100 mg/dL — AB
Specific Gravity, Urine: 1.017 (ref 1.005–1.030)
WBC, UA: >50 WBC/hpf — ABNORMAL HIGH (ref 0–5)
pH: 5 (ref 5.0–8.0)

## 2021-09-27 LAB — URINE CULTURE: Culture: >=100000 — AB

## 2021-09-27 LAB — COMPREHENSIVE METABOLIC PANEL
ALT: 17 U/L (ref 0–44)
AST: 15 U/L (ref 15–41)
Albumin: 3.3 g/dL — ABNORMAL LOW (ref 3.5–5.0)
Alkaline Phosphatase: 57 U/L (ref 38–126)
Anion gap: 10 (ref 5–15)
BUN: 8 mg/dL (ref 6–20)
CO2: 24 mmol/L (ref 22–32)
Calcium: 9 mg/dL (ref 8.9–10.3)
Chloride: 100 mmol/L (ref 98–111)
Creatinine, Ser: 0.9 mg/dL (ref 0.44–1.00)
GFR, Estimated: >60 mL/min (ref >60)
Glucose, Bld: 109 mg/dL — ABNORMAL HIGH (ref 70–99)
Potassium: 3.3 mmol/L — ABNORMAL LOW (ref 3.5–5.1)
Sodium: 134 mmol/L — ABNORMAL LOW (ref 135–145)
Total Bilirubin: 0.9 mg/dL (ref 0.3–1.2)
Total Protein: 7.2 g/dL (ref 6.5–8.1)

## 2021-09-27 LAB — I-STAT BETA HCG BLOOD, ED (MC, WL, AP ONLY): I-stat hCG, quantitative: 16.5 m[IU]/mL — ABNORMAL HIGH (ref <5)

## 2021-09-27 LAB — LIPASE, BLOOD: Lipase: 27 U/L (ref 11–51)

## 2021-09-27 LAB — PREGNANCY, URINE: Preg Test, Ur: NEGATIVE

## 2021-09-27 MED ORDER — SODIUM CHLORIDE 0.9 % IV BOLUS
1000.0000 mL | Freq: Once | INTRAVENOUS | Status: AC
  Administered 2021-09-27: 1000 mL via INTRAVENOUS

## 2021-09-27 MED ORDER — CEPHALEXIN 500 MG PO CAPS: 500.0000 mg | ORAL_CAPSULE | Freq: Three times a day (TID) | ORAL | 0 refills | Status: DC

## 2021-09-27 MED ORDER — ONDANSETRON 4 MG PO TBDP
4.0000 mg | ORAL_TABLET | Freq: Once | ORAL | Status: AC
  Administered 2021-09-27: 4 mg via ORAL
  Filled 2021-09-27: qty 1

## 2021-09-27 MED ORDER — ONDANSETRON 4 MG PO TBDP: ORAL_TABLET | ORAL | 0 refills | Status: DC

## 2021-09-27 MED ORDER — KETOROLAC TROMETHAMINE 15 MG/ML IJ SOLN
15.0000 mg | Freq: Once | INTRAMUSCULAR | Status: AC
  Administered 2021-09-27: 15 mg via INTRAVENOUS
  Filled 2021-09-27: qty 1

## 2021-09-27 MED ORDER — CEFTRIAXONE SODIUM 1 G IJ SOLR
1.0000 g | Freq: Once | INTRAMUSCULAR | Status: AC
  Administered 2021-09-27: 1 g via INTRAVENOUS
  Filled 2021-09-27: qty 10

## 2021-09-27 MED ORDER — ACETAMINOPHEN 325 MG PO TABS
650.0000 mg | ORAL_TABLET | Freq: Once | ORAL | Status: AC
  Administered 2021-09-27: 650 mg via ORAL
  Filled 2021-09-27: qty 2

## 2021-09-27 NOTE — ED Provider Notes (Addendum)
[facility] EMERGENCY DEPARTMENT Provider Note   CSN: [id number] Arrival date & time: 09/27/21  7829     History  Chief Complaint  Patient presents with   Fever   Flank Pain    Christy Thomas is a 26 y.o. female.  Patient presents with right flank pain gradually worsening over the past few days.  Patient was diagnosed with urine infection and started antibiotics 1 day ago.  Patient started feeling feverish and nauseated.  No history of kidney stone.  Pain fairly constant.      Home Medications Prior to Admission medications   Medication Sig Start Date End Date Taking? Authorizing Provider  cephALEXin (KEFLEX) 500 MG capsule Take 1 capsule (500 mg total) by mouth 3 (three) times daily for 10 days. 09/27/21 10/07/21 Yes Blane Ohara, MD  ondansetron (ZOFRAN-ODT) 4 MG disintegrating tablet 4mg  ODT q4 hours prn nausea/vomit 09/27/21  Yes Blane Ohara, MD  AUROVELA 24 FE 1-20 MG-MCG(24) tablet Take 1 tablet by mouth daily. 09/02/20   [provider]  ibuprofen (ADVIL) 600 MG tablet Take 1 tablet (600 mg total) by mouth every 6 (six) hours as needed. 10/19/20   Lurline Idol, FNP      Allergies    Patient has no known allergies.    Review of Systems   Review of Systems  Constitutional:  Positive for fatigue and fever. Negative for chills.  HENT:  Negative for congestion.   Eyes:  Negative for visual disturbance.  Respiratory:  Negative for shortness of breath.   Cardiovascular:  Negative for chest pain.  Gastrointestinal:  Positive for nausea. Negative for abdominal pain and vomiting.  Genitourinary:  Positive for flank pain. Negative for dysuria.  Musculoskeletal:  Positive for back pain. Negative for neck pain and neck stiffness.  Skin:  Negative for rash.  Neurological:  Negative for light-headedness and headaches.   Physical Exam Updated Vital Signs BP 102/76   Pulse (!) 109   Temp 99.6 F (37.6 C) (Oral)   Resp 15   LMP 09/25/2021  (Approximate)   SpO2 100%  Physical Exam Vitals and nursing note reviewed.  Constitutional:      General: She is not in acute distress.    Appearance: She is well-developed.  HENT:     Head: Normocephalic and atraumatic.     Mouth/Throat:     Mouth: Mucous membranes are moist.  Eyes:     General:        Right eye: No discharge.        Left eye: No discharge.     Conjunctiva/sclera: Conjunctivae normal.  Neck:     Trachea: No tracheal deviation.  Cardiovascular:     Rate and Rhythm: Regular rhythm. Tachycardia present.     Heart sounds: No murmur heard. Pulmonary:     Effort: Pulmonary effort is normal.     Breath sounds: Normal breath sounds.  Abdominal:     General: There is no distension.     Palpations: Abdomen is soft.     Tenderness: There is no abdominal tenderness. There is no guarding.  Musculoskeletal:        General: Tenderness (right flank renal area) present.     Cervical back: Normal range of motion and neck supple. No rigidity.  Skin:    General: Skin is warm.     Capillary Refill: Capillary refill takes less than 2 seconds.     Findings: No rash.  Neurological:     General: No focal deficit  present.     Mental Status: She is alert.     Cranial Nerves: No cranial nerve deficit.  Psychiatric:        Mood and Affect: Mood normal.    ED Results / Procedures / Treatments   Labs (all labs ordered are listed, but only abnormal results are displayed) Labs Reviewed  CBC WITH DIFFERENTIAL/PLATELET - Abnormal; Notable for the following components:      Result Value   WBC 15.7 (*)    Neutro Abs 13.0 (*)    Monocytes Absolute 1.8 (*)    Abs Immature Granulocytes 0.09 (*)    All other components within normal limits  COMPREHENSIVE METABOLIC PANEL - Abnormal; Notable for the following components:   Sodium 134 (*)    Potassium 3.3 (*)    Glucose, Bld 109 (*)    Albumin 3.3 (*)    All other components within normal limits  URINALYSIS, ROUTINE W REFLEX  MICROSCOPIC - Abnormal; Notable for the following components:   APPearance HAZY (*)    Hgb urine dipstick MODERATE (*)    Ketones, ur 80 (*)    Protein, ur 100 (*)    Leukocytes,Ua MODERATE (*)    WBC, UA >50 (*)    Bacteria, UA RARE (*)    Non Squamous Epithelial 0-5 (*)    All other components within normal limits  I-STAT BETA HCG BLOOD, ED ([facility], [facility], [facility] ONLY) - Abnormal; Notable for the following components:   I-stat hCG, quantitative 16.5 (*)    All other components within normal limits  URINE CULTURE  LIPASE, BLOOD  PREGNANCY, URINE    EKG None  Radiology CT Renal Stone Study  Result Date: 09/27/2021 CLINICAL DATA:  Acute right flank pain. EXAM: CT ABDOMEN AND PELVIS WITHOUT CONTRAST TECHNIQUE: Multidetector CT imaging of the abdomen and pelvis was performed following the standard protocol without IV contrast. RADIATION DOSE REDUCTION: This exam was performed according to the departmental dose-optimization program which includes automated exposure control, adjustment of the mA and/or kV according to patient size and/or use of iterative reconstruction technique. COMPARISON:  None. FINDINGS: Lower chest: No acute abnormality. Hepatobiliary: No focal liver abnormality is seen. No gallstones, gallbladder wall thickening, or biliary dilatation. Pancreas: Unremarkable. No pancreatic ductal dilatation or surrounding inflammatory changes. Spleen: Normal in size without focal abnormality. Adrenals/Urinary Tract: Adrenal glands are unremarkable. No renal or ureteral calculi are noted. No hydronephrosis or renal obstruction is noted. Urinary bladder is unremarkable. Mild inflammatory changes are noted around the right kidney concerning for possible pyelonephritis. Stomach/Bowel: Stomach is within normal limits. Appendix appears normal. No evidence of bowel wall thickening, distention, or inflammatory changes. Vascular/Lymphatic: No significant vascular findings are present. No enlarged abdominal or  pelvic lymph nodes. Reproductive: Uterus and bilateral adnexa are unremarkable. Other: No hernia is noted. Small amount of free fluid is noted in the pelvis which may be physiologic. Musculoskeletal: No acute or significant osseous findings. IMPRESSION: Findings concerning for right pyelonephritis. No hydronephrosis or renal obstruction is noted. Electronically Signed   By: Lupita Raider M.D.   On: 09/27/2021 12:01    Procedures Procedures    Medications Ordered in ED Medications  cefTRIAXone (ROCEPHIN) 1 g in sodium chloride 0.9 % 100 mL IVPB (1 g Intravenous New Bag/Given 09/27/21 1359)  acetaminophen (TYLENOL) tablet 650 mg (650 mg Oral Given 09/27/21 0947)  ondansetron (ZOFRAN-ODT) disintegrating tablet 4 mg (4 mg Oral Given 09/27/21 0947)  sodium chloride 0.9 % bolus 1,000 mL (1,000 mLs Intravenous  New Bag/Given 09/27/21 1357)  ketorolac (TORADOL) 15 MG/ML injection 15 mg (15 mg Intravenous Given 09/27/21 1356)    ED Course/ Medical Decision Making/ A&P                           Medical Decision Making Amount and/or Complexity of Data Reviewed Labs: ordered.  Risk OTC drugs. Prescription drug management.   Patient with no significant medical history presents with clinical concern for pyelonephritis given recent urine infection, taking antibiotics that do not treat kidney infection and gradually worsening signs and symptoms.  Medical records reviewed showing note from urgent care 2 days prior and Macrobid ordered and abnormal urine.  Patient initially tachycardic, feeling generally unwell.  Blood work ordered and reviewed showing leukocytosis 15.7 secondary to urine infection, urinalysis showed ketones and greater than 50 white blood cells, electrolytes 134 sodium and 3.3 potassium.  Urine preg test negative.   Patient's heart rate improved to 105, clinically improved after IV fluids, Rocephin ordered and urine culture sent.  CT scan reviewed results no kidney stones or other acute  abnormalities except for signs of pyelonephritis.  Plan for pain meds/antipyretics as needed, start Keflex tomorrow morning and Zofran as needed.  Follow-up discussed.         Final Clinical Impression(s) / ED Diagnoses Final diagnoses:  Acute pyelonephritis  Hyponatremia  Leukocytosis, unspecified type    Rx / DC Orders ED Discharge Orders          Ordered    cephALEXin (KEFLEX) 500 MG capsule  3 times daily        09/27/21 1420    ondansetron (ZOFRAN-ODT) 4 MG disintegrating tablet        09/27/21 1426              Blane Ohara, MD 09/27/21 1427    Blane Ohara, MD 09/27/21 1428

## 2021-09-27 NOTE — Discharge Instructions (Signed)
Take Tylenol or ibuprofen every 6 hours as needed for pain and fevers. ?Stay well-hydrated with water. ?Stop taking your oral antibiotics that were previously prescribed and start taking new antibiotic prescription starting tomorrow morning. ?Return for persistent vomiting, uncontrolled pain or new concerns. ?

## 2021-09-27 NOTE — ED Triage Notes (Signed)
Ppt. Stated, I started with a UTI and Im taking an antibiotic  a week and half ago BUT started taking the medicine yesterday. Im having nausea with fever and pain on the right side.  ?

## 2021-09-27 NOTE — ED Provider Triage Note (Signed)
Emergency Medicine Provider Triage Evaluation Note ? ?Christy Thomas , a 26 y.o. female  was evaluated in triage.  Pt complains of right flank pain.  She was diagnosed with a urinary tract infection a couple days ago and started her antibiotic yesterday.  Yesterday and today she is felt feverish, nauseated with severe right flank pain.  No history of kidney stone. ? ?Review of Systems  ?Positive: Fevers, flank pain, dysuria, nausea, vomiting ?Negative:  ? ?Physical Exam  ?BP 124/87 (BP Location: Right Arm)   Pulse (!) 135   Temp (!) 102.9 ?F (39.4 ?C) (Oral)   Resp 18   LMP 09/25/2021 (Approximate)   SpO2 100%  ?Gen:   Awake, no distress   ?Resp:  Normal effort  ?MSK:   Moves extremities without difficulty  ?Other:  Severe right CVA tenderness.  Nontender abdomen or suprapubic area ? ?Medical Decision Making  ?Medically screening exam initiated at 9:45 AM.  Appropriate orders placed.  Christy Thomas was informed that the remainder of the evaluation will be completed by another provider, this initial triage assessment does not replace that evaluation, and the importance of remaining in the ED until their evaluation is complete. ? ? ?Labs and CT renal ordered ?  ?Saddie Benders, PA-C ?09/27/21 [id number] ? ?

## 2021-09-28 ENCOUNTER — Emergency Department (HOSPITAL_COMMUNITY): Payer: BC Managed Care – PPO

## 2021-09-28 ENCOUNTER — Encounter (HOSPITAL_COMMUNITY): Payer: Self-pay | Admitting: Internal Medicine

## 2021-09-28 ENCOUNTER — Inpatient Hospital Stay (HOSPITAL_COMMUNITY)
Admission: EM | Admit: 2021-09-28 | Discharge: 2021-10-02 | DRG: 690 | Disposition: A | Discharge status: Home Health | Payer: BC Managed Care – PPO | Attending: Family Medicine | Admitting: Family Medicine

## 2021-09-28 DIAGNOSIS — E871 Hypo-osmolality and hyponatremia: Secondary | ICD-10-CM | POA: Diagnosis present

## 2021-09-28 DIAGNOSIS — N390 Urinary tract infection, site not specified: Secondary | ICD-10-CM

## 2021-09-28 DIAGNOSIS — R0600 Dyspnea, unspecified: Secondary | ICD-10-CM | POA: Diagnosis not present

## 2021-09-28 DIAGNOSIS — E876 Hypokalemia: Secondary | ICD-10-CM | POA: Diagnosis present

## 2021-09-28 DIAGNOSIS — A419 Sepsis, unspecified organism: Secondary | ICD-10-CM | POA: Diagnosis not present

## 2021-09-28 DIAGNOSIS — R059 Cough, unspecified: Secondary | ICD-10-CM | POA: Diagnosis not present

## 2021-09-28 DIAGNOSIS — A499 Bacterial infection, unspecified: Principal | ICD-10-CM

## 2021-09-28 DIAGNOSIS — Z87440 Personal history of urinary (tract) infections: Secondary | ICD-10-CM | POA: Diagnosis not present

## 2021-09-28 DIAGNOSIS — Z807 Family history of other malignant neoplasms of lymphoid, hematopoietic and related tissues: Secondary | ICD-10-CM | POA: Diagnosis not present

## 2021-09-28 DIAGNOSIS — Z1612 Extended spectrum beta lactamase (ESBL) resistance: Secondary | ICD-10-CM | POA: Diagnosis present

## 2021-09-28 DIAGNOSIS — B9620 Unspecified Escherichia coli [E. coli] as the cause of diseases classified elsewhere: Secondary | ICD-10-CM | POA: Diagnosis present

## 2021-09-28 DIAGNOSIS — N12 Tubulo-interstitial nephritis, not specified as acute or chronic: Secondary | ICD-10-CM

## 2021-09-28 DIAGNOSIS — Z20822 Contact with and (suspected) exposure to covid-19: Secondary | ICD-10-CM | POA: Diagnosis present

## 2021-09-28 DIAGNOSIS — D649 Anemia, unspecified: Secondary | ICD-10-CM | POA: Diagnosis present

## 2021-09-28 DIAGNOSIS — N10 Acute tubulo-interstitial nephritis: Secondary | ICD-10-CM | POA: Diagnosis present

## 2021-09-28 DIAGNOSIS — B9629 Other Escherichia coli [E. coli] as the cause of diseases classified elsewhere: Secondary | ICD-10-CM | POA: Diagnosis present

## 2021-09-28 LAB — URINALYSIS, ROUTINE W REFLEX MICROSCOPIC
Bilirubin Urine: NEGATIVE
Glucose, UA: NEGATIVE mg/dL
Ketones, ur: 20 mg/dL — AB
Leukocytes,Ua: NEGATIVE
Nitrite: NEGATIVE
Protein, ur: 100 mg/dL — AB
Specific Gravity, Urine: 1.018 (ref 1.005–1.030)
pH: 5 (ref 5.0–8.0)

## 2021-09-28 LAB — URINE CULTURE

## 2021-09-28 LAB — RESP PANEL BY RT-PCR (FLU A&B, COVID) ARPGX2
Influenza A by PCR: NEGATIVE
Influenza B by PCR: NEGATIVE
SARS Coronavirus 2 by RT PCR: NEGATIVE

## 2021-09-28 LAB — COMPREHENSIVE METABOLIC PANEL
ALT: 20 U/L (ref 0–44)
AST: 19 U/L (ref 15–41)
Albumin: 3.1 g/dL — ABNORMAL LOW (ref 3.5–5.0)
Alkaline Phosphatase: 61 U/L (ref 38–126)
Anion gap: 10 (ref 5–15)
BUN: 7 mg/dL (ref 6–20)
CO2: 23 mmol/L (ref 22–32)
Calcium: 8.9 mg/dL (ref 8.9–10.3)
Chloride: 103 mmol/L (ref 98–111)
Creatinine, Ser: 0.94 mg/dL (ref 0.44–1.00)
GFR, Estimated: >60 mL/min (ref >60)
Glucose, Bld: 106 mg/dL — ABNORMAL HIGH (ref 70–99)
Potassium: 3 mmol/L — ABNORMAL LOW (ref 3.5–5.1)
Sodium: 136 mmol/L (ref 135–145)
Total Bilirubin: 0.7 mg/dL (ref 0.3–1.2)
Total Protein: 7.2 g/dL (ref 6.5–8.1)

## 2021-09-28 LAB — CBC WITH DIFFERENTIAL/PLATELET
Abs Immature Granulocytes: 0.07 10*3/uL (ref 0.00–0.07)
Basophils Absolute: 0 10*3/uL (ref 0.0–0.1)
Basophils Relative: 0 %
Eosinophils Absolute: 0 10*3/uL (ref 0.0–0.5)
Eosinophils Relative: 0 %
HCT: 34.8 % — ABNORMAL LOW (ref 36.0–46.0)
Hemoglobin: 11.8 g/dL — ABNORMAL LOW (ref 12.0–15.0)
Immature Granulocytes: 1 %
Lymphocytes Relative: 11 %
Lymphs Abs: 1.2 10*3/uL (ref 0.7–4.0)
MCH: 30.2 pg (ref 26.0–34.0)
MCHC: 33.9 g/dL (ref 30.0–36.0)
MCV: 89 fL (ref 80.0–100.0)
Monocytes Absolute: 1.6 10*3/uL — ABNORMAL HIGH (ref 0.1–1.0)
Monocytes Relative: 14 %
Neutro Abs: 8.4 10*3/uL — ABNORMAL HIGH (ref 1.7–7.7)
Neutrophils Relative %: 74 %
Platelets: 236 10*3/uL (ref 150–400)
RBC: 3.91 MIL/uL (ref 3.87–5.11)
RDW: 13.9 % (ref 11.5–15.5)
WBC: 11.4 10*3/uL — ABNORMAL HIGH (ref 4.0–10.5)
nRBC: 0 % (ref 0.0–0.2)

## 2021-09-28 LAB — PROTIME-INR
INR: 1.3 — ABNORMAL HIGH (ref 0.8–1.2)
Prothrombin Time: 16.2 seconds — ABNORMAL HIGH (ref 11.4–15.2)

## 2021-09-28 LAB — PREGNANCY, URINE: Preg Test, Ur: NEGATIVE

## 2021-09-28 LAB — I-STAT BETA HCG BLOOD, ED (MC, WL, AP ONLY): I-stat hCG, quantitative: 18.3 m[IU]/mL — ABNORMAL HIGH (ref <5)

## 2021-09-28 LAB — LACTIC ACID, PLASMA: Lactic Acid, Venous: 0.8 mmol/L (ref 0.5–1.9)

## 2021-09-28 LAB — MAGNESIUM: Magnesium: 1.9 mg/dL (ref 1.7–2.4)

## 2021-09-28 LAB — APTT: aPTT: 33 seconds (ref 24–36)

## 2021-09-28 MED ORDER — POTASSIUM CHLORIDE 10 MEQ/100ML IV SOLN
10.0000 meq | INTRAVENOUS | Status: AC
  Administered 2021-09-28 (×3): 10 meq via INTRAVENOUS
  Filled 2021-09-28 (×4): qty 100

## 2021-09-28 MED ORDER — MEROPENEM 1 G IV SOLR
1.0000 g | Freq: Three times a day (TID) | INTRAVENOUS | Status: DC
Start: 2021-09-28 — End: 2021-09-30
  Administered 2021-09-28 – 2021-09-30 (×7): 1 g via INTRAVENOUS
  Filled 2021-09-28 (×8): qty 20

## 2021-09-28 MED ORDER — IBUPROFEN 800 MG PO TABS
800.0000 mg | ORAL_TABLET | Freq: Once | ORAL | Status: AC
  Administered 2021-09-28: 800 mg via ORAL
  Filled 2021-09-28: qty 1

## 2021-09-28 MED ORDER — LACTATED RINGERS IV SOLN: INTRAVENOUS | Status: AC

## 2021-09-28 MED ORDER — LACTATED RINGERS IV BOLUS (SEPSIS)
1000.0000 mL | Freq: Once | INTRAVENOUS | Status: AC
  Administered 2021-09-28: 1000 mL via INTRAVENOUS

## 2021-09-28 MED ORDER — ACETAMINOPHEN 500 MG PO TABS
1000.0000 mg | ORAL_TABLET | Freq: Four times a day (QID) | ORAL | Status: DC | PRN
  Administered 2021-09-29 – 2021-09-30 (×2): 1000 mg via ORAL
  Filled 2021-09-28 (×3): qty 2

## 2021-09-28 MED ORDER — LACTATED RINGERS IV BOLUS
500.0000 mL | Freq: Once | INTRAVENOUS | Status: AC
  Administered 2021-09-28: 500 mL via INTRAVENOUS

## 2021-09-28 MED ORDER — ACETAMINOPHEN 650 MG RE SUPP: 650.0000 mg | Freq: Four times a day (QID) | RECTAL | Status: DC | PRN

## 2021-09-28 MED ORDER — ACETAMINOPHEN 500 MG PO TABS
1000.0000 mg | ORAL_TABLET | Freq: Once | ORAL | Status: AC
  Administered 2021-09-28: 1000 mg via ORAL
  Filled 2021-09-28: qty 2

## 2021-09-28 MED ORDER — SODIUM CHLORIDE 0.9 % IV SOLN
2.0000 g | INTRAVENOUS | Status: DC
  Filled 2021-09-28: qty 20

## 2021-09-28 MED ORDER — ENOXAPARIN SODIUM 40 MG/0.4ML IJ SOSY
40.0000 mg | PREFILLED_SYRINGE | INTRAMUSCULAR | Status: DC
  Administered 2021-09-28 – 2021-10-02 (×5): 40 mg via SUBCUTANEOUS
  Filled 2021-09-28 (×5): qty 0.4

## 2021-09-28 MED ORDER — KETOROLAC TROMETHAMINE 30 MG/ML IJ SOLN
30.0000 mg | Freq: Four times a day (QID) | INTRAMUSCULAR | Status: DC | PRN
  Administered 2021-09-28 – 2021-09-30 (×4): 30 mg via INTRAVENOUS
  Filled 2021-09-28 (×5): qty 1

## 2021-09-28 MED ORDER — ONDANSETRON HCL 4 MG/2ML IJ SOLN: 4.0000 mg | Freq: Once | INTRAMUSCULAR | Status: DC

## 2021-09-28 MED ORDER — ONDANSETRON HCL 4 MG PO TABS: 4.0000 mg | ORAL_TABLET | Freq: Four times a day (QID) | ORAL | Status: DC | PRN

## 2021-09-28 MED ORDER — LACTATED RINGERS IV BOLUS
1000.0000 mL | Freq: Once | INTRAVENOUS | Status: AC
  Administered 2021-09-28: 1000 mL via INTRAVENOUS

## 2021-09-28 MED ORDER — ONDANSETRON HCL 4 MG/2ML IJ SOLN
4.0000 mg | Freq: Four times a day (QID) | INTRAMUSCULAR | Status: DC | PRN
  Filled 2021-09-28: qty 2

## 2021-09-28 NOTE — Assessment & Plan Note (Addendum)
ESBL E. coli UTI with right-sided pyelonephritis ?Met SIRS criteria but sepsis ruled out. ? ?-Continue metopenem ?

## 2021-09-28 NOTE — ED Provider Notes (Signed)
[facility] EMERGENCY DEPARTMENT Provider Note   CSN: [id number] Arrival date & time: 09/28/21  1056     History  Chief Complaint  Patient presents with   Abdominal Pain   Urinary Tract Infection    Christy Thomas is a 26 y.o. female.  This is a 26 y.o. female without significant medical history as below who presents to the ED with complaint of UTI  Patient having UTI symptoms for about 2 weeks.  She was started on antibiotics 2 days ago.  she was seen in the ER yesterday, urine culture has returned for ESBL this AM, she was instructed to return to the ER for evaluation.  CT from yesterday was reviewed, concerning for pyelonephritis.  No evidence of nephrolithiasis.  Patient reports ongoing right-sided flank pain, right lower quadrant pain.  She is having some mild nausea without vomiting.  Fevers and chills at home.  No significant dysuria but having some urgency.  She is currently menstruating.  Poor p.o. intake last few days secondary to discomfort, malaise.    No past medical history on file.  No past surgical history on file.    The history is provided by the patient. No language interpreter was used.  Abdominal Pain Associated symptoms: chills, fever and nausea   Associated symptoms: no chest pain, no cough, no hematuria, no shortness of breath and no vomiting   Urinary Tract Infection Associated symptoms: abdominal pain, fever and nausea   Associated symptoms: no vomiting       Home Medications Prior to Admission medications   Medication Sig Start Date End Date Taking? Authorizing Provider  acyclovir ointment (ZOVIRAX) 5 % Apply 1 application. topically 6 (six) times daily. Patient not taking: Reported on 09/28/2021    [provider]  AUROVELA 24 FE 1-20 MG-MCG(24) tablet Take 1 tablet by mouth daily. Patient not taking: Reported on 09/28/2021 09/02/20   [provider]  cephALEXin (KEFLEX) 500 MG capsule Take 1 capsule (500 mg  total) by mouth 3 (three) times daily for 10 days. 09/27/21 10/07/21  Blane Ohara, MD  ibuprofen (ADVIL) 600 MG tablet Take 1 tablet (600 mg total) by mouth every 6 (six) hours as needed. 10/19/20   Lurline Idol, FNP  norethindrone-ethinyl estradiol-FE (LOESTRIN FE) 1-20 MG-MCG tablet Take 1 tablet by mouth daily. Patient not taking: Reported on 09/28/2021    [provider]  ondansetron (ZOFRAN-ODT) 4 MG disintegrating tablet 4mg  ODT q4 hours prn nausea/vomit 09/27/21   Blane Ohara, MD  valACYclovir (VALTREX) 1000 MG tablet Take 500 mg by mouth daily. 09/09/21   [provider]      Allergies    Patient has no known allergies.    Review of Systems   Review of Systems  Constitutional:  Positive for appetite change, chills and fever.  HENT:  Negative for facial swelling and trouble swallowing.   Eyes:  Negative for photophobia and visual disturbance.  Respiratory:  Negative for cough and shortness of breath.   Cardiovascular:  Negative for chest pain and palpitations.  Gastrointestinal:  Positive for abdominal pain and nausea. Negative for vomiting.  Endocrine: Negative for polydipsia and polyuria.  Genitourinary:  Negative for difficulty urinating and hematuria.  Musculoskeletal:  Negative for gait problem and joint swelling.  Skin:  Negative for pallor and rash.  Neurological:  Negative for syncope and headaches.  Psychiatric/Behavioral:  Negative for agitation and confusion.    Physical Exam Updated Vital Signs BP (!) 106/57   Pulse (!) 135  Temp (!) 103.1 F (39.5 C) (Oral)   Resp 16   LMP 09/25/2021 (Approximate)   SpO2 97%  Physical Exam Vitals and nursing note reviewed.  Constitutional:      General: She is not in acute distress.    Appearance: Normal appearance. She is well-developed. She is not toxic-appearing.  HENT:     Head: Normocephalic and atraumatic.     Right Ear: External ear normal.     Left Ear: External ear normal.     Nose: Nose  normal.     Mouth/Throat:     Mouth: Mucous membranes are moist.  Eyes:     General: No scleral icterus.       Right eye: No discharge.        Left eye: No discharge.  Cardiovascular:     Rate and Rhythm: Regular rhythm. Tachycardia present.     Pulses: Normal pulses.     Heart sounds: Normal heart sounds.  Pulmonary:     Effort: Pulmonary effort is normal. No respiratory distress.     Breath sounds: Normal breath sounds.  Abdominal:     General: Abdomen is flat.     Palpations: Abdomen is soft.     Tenderness: There is abdominal tenderness. There is right CVA tenderness. There is no rebound.     Comments: Not peritoneal  Musculoskeletal:        General: Normal range of motion.     Cervical back: Normal range of motion.     Right lower leg: No edema.     Left lower leg: No edema.  Skin:    General: Skin is warm and dry.     Capillary Refill: Capillary refill takes less than 2 seconds.  Neurological:     Mental Status: She is alert and oriented to person, place, and time.     GCS: GCS eye subscore is 4. GCS verbal subscore is 5. GCS motor subscore is 6.  Psychiatric:        Mood and Affect: Mood normal.        Behavior: Behavior normal.    ED Results / Procedures / Treatments   Labs (all labs ordered are listed, but only abnormal results are displayed) Labs Reviewed  URINALYSIS, ROUTINE W REFLEX MICROSCOPIC - Abnormal; Notable for the following components:      Result Value   APPearance HAZY (*)    Hgb urine dipstick MODERATE (*)    Ketones, ur 20 (*)    Protein, ur 100 (*)    Bacteria, UA FEW (*)    All other components within normal limits  COMPREHENSIVE METABOLIC PANEL - Abnormal; Notable for the following components:   Potassium 3.0 (*)    Glucose, Bld 106 (*)    Albumin 3.1 (*)    All other components within normal limits  CBC WITH DIFFERENTIAL/PLATELET - Abnormal; Notable for the following components:   WBC 11.4 (*)    Hemoglobin 11.8 (*)    HCT 34.8 (*)     Neutro Abs 8.4 (*)    Monocytes Absolute 1.6 (*)    All other components within normal limits  PROTIME-INR - Abnormal; Notable for the following components:   Prothrombin Time 16.2 (*)    INR 1.3 (*)    All other components within normal limits  I-STAT BETA HCG BLOOD, ED ([facility], [facility], [facility] ONLY) - Abnormal; Notable for the following components:   I-stat hCG, quantitative 18.3 (*)    All other components within normal limits  RESP PANEL BY RT-PCR (FLU A&B, COVID) ARPGX2  URINE CULTURE  CULTURE, BLOOD (ROUTINE X 2)  CULTURE, BLOOD (ROUTINE X 2)  LACTIC ACID, PLASMA  APTT  LACTIC ACID, PLASMA  PREGNANCY, URINE  MAGNESIUM    EKG EKG Interpretation  Date/Time:  Wednesday September 28 2021 13:17:35 EDT Ventricular Rate:  99 PR Interval:  137 QRS Duration: 95 QT Interval:  327 QTC Calculation: 420 R Axis:   84 Text Interpretation: Sinus rhythm RSR' in V1 or V2, probably normal variant Borderline T wave abnormalities No old tracing to compare Confirmed by Tanda Rockers ([id number]) on 09/28/2021 2:03:12 PM  Radiology DG Chest Port 1 View  Result Date: 09/28/2021 CLINICAL DATA:  Question sepsis.  Fevers and chills. EXAM: PORTABLE CHEST 1 VIEW COMPARISON:  None. FINDINGS: The heart size and mediastinal contours are within normal limits. Both lungs are clear. The visualized skeletal structures are unremarkable. IMPRESSION: No active disease. Electronically Signed   By: Paulina Fusi M.D.   On: 09/28/2021 12:08   CT Renal Stone Study  Result Date: 09/27/2021 CLINICAL DATA:  Acute right flank pain. EXAM: CT ABDOMEN AND PELVIS WITHOUT CONTRAST TECHNIQUE: Multidetector CT imaging of the abdomen and pelvis was performed following the standard protocol without IV contrast. RADIATION DOSE REDUCTION: This exam was performed according to the departmental dose-optimization program which includes automated exposure control, adjustment of the mA and/or kV according to patient size and/or use of iterative  reconstruction technique. COMPARISON:  None. FINDINGS: Lower chest: No acute abnormality. Hepatobiliary: No focal liver abnormality is seen. No gallstones, gallbladder wall thickening, or biliary dilatation. Pancreas: Unremarkable. No pancreatic ductal dilatation or surrounding inflammatory changes. Spleen: Normal in size without focal abnormality. Adrenals/Urinary Tract: Adrenal glands are unremarkable. No renal or ureteral calculi are noted. No hydronephrosis or renal obstruction is noted. Urinary bladder is unremarkable. Mild inflammatory changes are noted around the right kidney concerning for possible pyelonephritis. Stomach/Bowel: Stomach is within normal limits. Appendix appears normal. No evidence of bowel wall thickening, distention, or inflammatory changes. Vascular/Lymphatic: No significant vascular findings are present. No enlarged abdominal or pelvic lymph nodes. Reproductive: Uterus and bilateral adnexa are unremarkable. Other: No hernia is noted. Small amount of free fluid is noted in the pelvis which may be physiologic. Musculoskeletal: No acute or significant osseous findings. IMPRESSION: Findings concerning for right pyelonephritis. No hydronephrosis or renal obstruction is noted. Electronically Signed   By: Lupita Raider M.D.   On: 09/27/2021 12:01    Procedures .Critical Care Performed by: Sloan Leiter, DO Authorized by: Sloan Leiter, DO   Critical care provider statement:    Critical care time (minutes):  49   Critical care time was exclusive of:  Separately billable procedures and treating other patients   Critical care was necessary to treat or prevent imminent or life-threatening deterioration of the following conditions:  Sepsis   Critical care was time spent personally by me on the following activities:  Development of treatment plan with patient or surrogate, discussions with consultants, evaluation of patient's response to treatment, examination of patient, ordering and  review of laboratory studies, ordering and review of radiographic studies, ordering and performing treatments and interventions, pulse oximetry, re-evaluation of patient's condition, review of old charts and obtaining history from patient or surrogate   Care discussed with: admitting provider      Medications Ordered in ED Medications  lactated ringers infusion (has no administration in time range)  meropenem (MERREM) 1 g in sodium chloride 0.9 % 100  mL IVPB (1 g Intravenous New Bag/Given 09/28/21 1300)  potassium chloride 10 mEq in 100 mL IVPB (has no administration in time range)  acetaminophen (TYLENOL) tablet 1,000 mg (1,000 mg Oral Given 09/28/21 1129)  ibuprofen (ADVIL) tablet 800 mg (800 mg Oral Given 09/28/21 1129)  lactated ringers bolus 1,000 mL (1,000 mLs Intravenous New Bag/Given 09/28/21 1228)    And  lactated ringers bolus 1,000 mL (1,000 mLs Intravenous New Bag/Given 09/28/21 1228)    ED Course/ Medical Decision Making/ A&P                           Medical Decision Making Amount and/or Complexity of Data Reviewed Labs: ordered. Radiology: ordered. ECG/medicine tests: ordered.  Risk Prescription drug management. Decision regarding hospitalization.   Initial Impression and Ddx This patient presents to the Emergency Department for the above complaint. This involves an extensive number of treatment options and is a complaint that carries with it a high risk of complications and morbidity. Vital signs were reviewed.   Serious etiologies considered.   Patient PMH that increases complexity of ED encounter:  n/a  Social determinants of health include - n/a   Previous records obtained and reviewed   Interpretation of Diagnostics Labs & imaging results that were available during my care of the patient were visualized by me and considered in my medical decision making.   Patient received CT imaging yesterday, will not repeat  Cardiac monitoring reviewed and interpreted  personally which shows sinus tachycardia   Patient Reassessment and Ultimate Disposition/Management  Patient management required discussion with the following services or consulting groups:  Hospitalist Service      Concern for sepsis on arrival.  Sepsis order set placed. Patient with pyelonephritis, likely source of her infection. Urine culture reviewed, ESBL>  start meropenem. Blood cultures obtained.  Sepsis bolus given.  Give antipyretic. She is HDS. Does not appear to have septic shock.   Recommend admission for pyelonephritis secondary to ESBL requiring IV antibiotics.  Patient is agreeable   Discussed with Dr. Allena Katz who accepts patient for admission.                   Complexity of Problems Addressed Acute illness or injury that poses threat of life of bodily function  Additional Data Reviewed and Analyzed Further history obtained from: Past medical history and medications listed in the EMR, Prior ED visit notes, and Prior labs/imaging results  Patient Encounter Risk Assessment Prescriptions and Consideration of hospitalization      This chart was dictated using voice recognition software.  Despite best efforts to proofread,  errors can occur which can change the documentation meaning.         Final Clinical Impression(s) / ED Diagnoses Final diagnoses:  ESBL (extended spectrum beta-lactamase) producing bacteria infection  Pyelonephritis  Sepsis, due to unspecified organism, unspecified whether acute organ dysfunction present [facility])    Rx / DC Orders ED Discharge Orders     None         Sloan Leiter, DO 09/28/21 1404

## 2021-09-28 NOTE — ED Triage Notes (Signed)
Pt. Is here from yesterday cause she is no better. Dx with pyelonephritis.  ?

## 2021-09-28 NOTE — ED Notes (Signed)
IV abx not approved nor available from pharmacy within the allotted code sepsis 1hr time frame  ?

## 2021-09-28 NOTE — ED Notes (Signed)
Pt's pressure still soft regarding 1L bolus. MD made aware. Additional LR ordered.  ?

## 2021-09-28 NOTE — ED Provider Triage Note (Signed)
Emergency Medicine Provider Triage Evaluation Note ? ?Christy Thomas , a 27 y.o. female  was evaluated in triage.  Pt was seen yesterday, diagnosed with pyelonephritis and called this morning because her culture would not be sensitive to the antibiotics prescribed to her.  She was told that she likely needs to be admitted for IV antibiotics. ? ?Review of Systems  ?Positive: Pain, fevers, chills, nausea ?Negative: Chest pain or difficulty breathing ? ?Physical Exam  ?BP 123/86   Pulse (!) 135   Temp (!) 103.1 ?F (39.5 ?C) (Oral)   Resp (!) 21   LMP 09/25/2021 (Approximate)   SpO2 97%  ?Gen:   Awake, no distress   ?Resp:  Normal effort  ?MSK:   Moves extremities without difficulty  ?Other:   ? ?Medical Decision Making  ?Medically screening exam initiated at 11:29 AM.  Appropriate orders placed.  Christy Thomas was informed that the remainder of the evaluation will be completed by another provider, this initial triage assessment does not replace that evaluation, and the importance of remaining in the ED until their evaluation is complete. ? ?Per result review, patient has multiple species on her culture and it is suggested that she has a recollection ?  ?Christy Benders, PA-C ?09/28/21 1130 ? ?

## 2021-09-28 NOTE — Sepsis Progress Note (Signed)
eLink is following this Code Sepsis. °

## 2021-09-28 NOTE — H&P (Signed)
?History and Physical  ? ? Christy Thomas:[id number] DOB: Nov 03, 1995 DOA: 09/28/2021 ? ?PCP: Pcp, No  ?Patient coming from: Home ? ?I have personally briefly reviewed patient's old medical records in [facility] ? ?Chief Complaint: ESBL E. coli UTI ? ?HPI: ?Christy Thomas is a 26 y.o. female with no known significant medical history who presented to the ED for evaluation and management of ESBL E. coli UTI. ? ?Patient reports a history of frequent UTIs.  2 weeks ago she developed recurrent UTI symptoms with burning and pain on urination.  She had increased urinary frequency.  1 week ago she developed right-sided flank pain. ? ?She was seen in urgent care 3/19 with dysuria and right flank pain.  Urinalysis was suggestive of UTI.  Urine culture was collected and pending at that time.  Patient was discharged on course of oral Macrobid. ? ?Patient returned to ED on 3/21 for worsening right flank pain with fevers and nausea.  CT renal stone study showed findings concerning for right-sided pyelonephritis.  No hydronephrosis or renal obstruction seen.  Patient was given IV fluids, IV ceftriaxone, and discharged with prescription for oral Keflex. ? ?Patient was notified today (3/22) that urine culture from 3/19 has grown ESBL producing E. coli and was advised to come to the ED for further evaluation and management. ? ?Patient states that over the last few days she has had continued subjective fevers, chills, diaphoresis, nausea, vomiting, right flank pain.  She has not been able to maintain adequate oral intake.  She has been intermittently lightheaded.  She has had some loose stools. ? ?ED Course  Labs/Imaging on admission: I have personally reviewed following labs and imaging studies. ? ?Initial vitals showed BP 123/86, pulse 135, RR 21, temp 103.1 ?F, SPO2 97% on room air. ? ?Labs show WBC 11.4, hemoglobin 11.8, platelets 236,000, sodium 136, potassium 3.0, bicarb 23, BUN 7, creatinine 0.94, serum glucose 106,  LFTs within normal limits, INR 1.3, lactic acid 0.8. ? ?Blood cultures in process.  I-STAT beta-hCG 18.3, urine pregnancy pending.  SARS-CoV-2 and influenza PCR negative. ? ?Portable chest x-ray negative for focal consolidation, edema, effusion. ? ?Patient was given 2 L LR, IV K 10 mEq x 4, and started on IV meropenem.  The hospitalist service was consulted to admit for further evaluation and management. ? ?Review of Systems: All systems reviewed and are negative except as documented in history of present illness above. ? ? ?No past medical history on file. ? ?No past surgical history on file. ? ?Social History: ? reports that she has never smoked. She has never used smokeless tobacco. She reports current alcohol use of about 3.0 standard drinks per week. She reports current drug use. Drug: Marijuana. ? ?No Known Allergies ? ?Family History  ?Problem Relation Age of Onset  ? Lymphoma Mother   ? ? ? ?Prior to Admission medications   ?Medication Sig Start Date End Date Taking? Authorizing Provider  ?acyclovir ointment (ZOVIRAX) 5 % Apply 1 application. topically 6 (six) times daily. ?Patient not taking: Reported on 09/28/2021    [provider]  ?AUROVELA 24 FE 1-20 MG-MCG(24) tablet Take 1 tablet by mouth daily. ?Patient not taking: Reported on 09/28/2021 09/02/20   [provider]  ?cephALEXin (KEFLEX) 500 MG capsule Take 1 capsule (500 mg total) by mouth 3 (three) times daily for 10 days. 09/27/21 10/07/21  Blane Ohara, MD  ?ibuprofen (ADVIL) 600 MG tablet Take 1 tablet (600 mg total) by mouth every 6 (six)  hours as needed. 10/19/20   Lurline Idol, FNP  ?norethindrone-ethinyl estradiol-FE (LOESTRIN FE) 1-20 MG-MCG tablet Take 1 tablet by mouth daily. ?Patient not taking: Reported on 09/28/2021    [provider]  ?ondansetron (ZOFRAN-ODT) 4 MG disintegrating tablet 4mg  ODT q4 hours prn nausea/vomit 09/27/21   09/29/21, MD  ?valACYclovir (VALTREX) 1000 MG tablet Take 500 mg by mouth  daily. 09/09/21   [provider]  ? ? ?Physical Exam: ?Vitals:  ? 09/28/21 1105 09/28/21 1315 09/28/21 1400 09/28/21 1415  ?BP: 123/86 (!) 106/57 100/63 104/81  ?Pulse: (!) 135  97 99  ?Resp: (!) 21 16 18 15   ?Temp: (!) 103.1 ?F (39.5 ?C)     ?TempSrc: Oral     ?SpO2: 97%  98% 96%  ? ?Constitutional: Resting in bed, NAD, calm, comfortable ?Eyes: PERRL, lids and conjunctivae normal ?ENMT: Mucous membranes are moist. Posterior pharynx clear of any exudate or lesions.Normal dentition.  ?Neck: normal, supple, no masses. ?Respiratory: clear to auscultation bilaterally, no wheezing, no crackles. Normal respiratory effort. No accessory muscle use.  ?Cardiovascular: Tachycardic, no murmurs / rubs / gallops. No extremity edema. 2+ pedal pulses. ?Abdomen: Right flank tenderness, no masses palpated. No hepatosplenomegaly.  ?Musculoskeletal: no clubbing / cyanosis. No joint deformity upper and lower extremities. Good ROM, no contractures. Normal muscle tone.  ?Skin: Diaphoretic, no rashes, lesions, ulcers. No induration ?Neurologic: CN 2-12 grossly intact. Sensation intact. Strength 5/5 in all 4.  ?Psychiatric: Normal judgment and insight. Alert and oriented x 3. Normal mood.  ? ?EKG: Personally reviewed. Normal sinus rhythm, RSR pattern in V1.  No prior for comparison. ? ?Assessment/Plan ?Principal Problem: ?  Sepsis due to urinary tract infection (HCC) ?Active Problems: ?  Urinary tract infection due to extended-spectrum beta lactamase (ESBL) producing Escherichia coli ?  Hypokalemia ?  ?Christy Thomas is a 26 y.o. female with no known significant medical history who is admitted with sepsis due to ESBL E. coli UTI with right-sided pyelonephritis. ? ?Assessment and Plan: ?* Sepsis due to urinary tract infection (HCC) ?ESBL E. coli UTI with right-sided pyelonephritis ?Patient presenting with fever, tachycardia, tachypnea with recent findings of right-sided pyelonephritis due to ESBL E. coli UTI grown on culture  collected 3/19.  CT renal stone study 3/21 negative for hydronephrosis or renal obstruction. ?-Continue IV meropenem ?-Follow blood cultures ?-Continue IV fluid hydration overnight ?-Continue Tylenol and IV Toradol as needed for pain, antiemetics as needed ? ?Hypokalemia ?IV supplement given in the ED.  Check magnesium and replete if needed.  Repeat labs in AM. ? ?DVT prophylaxis: enoxaparin (LOVENOX) injection 40 mg Start: 09/28/21 1430 ?Code Status: Full code ?Family Communication: Discussed with patient stepfather 4/21 by phone, [phone] ?Disposition Plan: From home and likely discharged home pending clinical progress ?Consults called: None ?Severity of Illness: ?The appropriate patient status for this patient is OBSERVATION. Observation status is judged to be reasonable and necessary in order to provide the required intensity of service to ensure the patient's safety. The patient's presenting symptoms, physical exam findings, and initial radiographic and laboratory data in the context of their medical condition is felt to place them at decreased risk for further clinical deterioration. Furthermore, it is anticipated that the patient will be medically stable for discharge from the hospital within 2 midnights of admission.   ?Arloa Koh MD ?[facility] ? ?If 7PM-7AM, please contact night-coverage ?[email or web address] ? ?09/28/2021, 2:30 PM  ?

## 2021-09-28 NOTE — Assessment & Plan Note (Addendum)
Replaced and resolved. ?

## 2021-09-28 NOTE — ED Notes (Signed)
Pt's BP dropped down to 88/66. Dr. Allena Katz made aware. 1 bolus of LR ordered.  ?

## 2021-09-28 NOTE — ED Provider Notes (Signed)
Reviewed urine culture results with nursing and unfortunately patient has resistant urine infection that will not be treated adequately with Keflex/Rocephin.  Called the patient she is still feeling generally unwell instructed her to return to the adult emergency department for likely IV antibiotics/admission.  Patient feels comfortable driving and will return shortly. ? ?Enid Skeens ? ?  ?Blane Ohara, MD ?09/28/21 1018 ? ?

## 2021-09-28 NOTE — Hospital Course (Addendum)
Ms Smalling is a 26 y.o. F with no PMHx who presented with 2 weeks UTI symptoms, now several days flank pain, fever and nausea.   ? ? ?3/21: Initially seen in ER, CT renal unremarkable, discharged with cephalexin ?3/23: urine culture resulted with ESBL, called back to ER and admitted on Meropenem  ?

## 2021-09-29 DIAGNOSIS — N390 Urinary tract infection, site not specified: Secondary | ICD-10-CM | POA: Diagnosis not present

## 2021-09-29 DIAGNOSIS — A419 Sepsis, unspecified organism: Secondary | ICD-10-CM | POA: Diagnosis not present

## 2021-09-29 LAB — BASIC METABOLIC PANEL
Anion gap: 6 (ref 5–15)
BUN: 6 mg/dL (ref 6–20)
CO2: 23 mmol/L (ref 22–32)
Calcium: 8.1 mg/dL — ABNORMAL LOW (ref 8.9–10.3)
Chloride: 108 mmol/L (ref 98–111)
Creatinine, Ser: 0.82 mg/dL (ref 0.44–1.00)
GFR, Estimated: >60 mL/min (ref >60)
Glucose, Bld: 97 mg/dL (ref 70–99)
Potassium: 3.6 mmol/L (ref 3.5–5.1)
Sodium: 137 mmol/L (ref 135–145)

## 2021-09-29 LAB — CBC
HCT: 29.8 % — ABNORMAL LOW (ref 36.0–46.0)
Hemoglobin: 10 g/dL — ABNORMAL LOW (ref 12.0–15.0)
MCH: 30.2 pg (ref 26.0–34.0)
MCHC: 33.6 g/dL (ref 30.0–36.0)
MCV: 90 fL (ref 80.0–100.0)
Platelets: 286 10*3/uL (ref 150–400)
RBC: 3.31 MIL/uL — ABNORMAL LOW (ref 3.87–5.11)
RDW: 14 % (ref 11.5–15.5)
WBC: 5.4 10*3/uL (ref 4.0–10.5)
nRBC: 0 % (ref 0.0–0.2)

## 2021-09-29 LAB — HIV ANTIBODY (ROUTINE TESTING W REFLEX): HIV Screen 4th Generation wRfx: NONREACTIVE

## 2021-09-29 MED ORDER — ALUM & MAG HYDROXIDE-SIMETH 200-200-20 MG/5ML PO SUSP
30.0000 mL | ORAL | Status: DC | PRN
  Administered 2021-09-29 – 2021-10-01 (×2): 30 mL via ORAL
  Filled 2021-09-29 (×2): qty 30

## 2021-09-29 MED ORDER — POTASSIUM CHLORIDE 20 MEQ PO PACK
40.0000 meq | PACK | Freq: Once | ORAL | Status: AC
  Administered 2021-09-29: 40 meq via ORAL
  Filled 2021-09-29: qty 2

## 2021-09-29 NOTE — Progress Notes (Addendum)
?  Progress Note ? ? ?Patient: Christy Thomas [id number] DOB: 31-Jan-1996 DOA: 09/28/2021     1 ? ?DOS: the patient was seen and examined on 09/29/2021 ?  ?Brief hospital course: ?This 26 y.o. female with no known significant medical history who is admitted with sepsis due to ESBL E. coli UTI with right-sided pyelonephritis.  Patient reported 2 weeks ago she developed UTI symptoms she went to urgent care, UA was suggestive for UTI, she is started on Macrobid and urine culture was collected.  Urine culture came back growing ESBL, she presented in the ED right flank pain, fever and chills.  CT showed findings concerning for right-sided pyelonephritis. ?Patient is admitted for sepsis secondary to pyelonephritis started on meropenem. ? ?Assessment and Plan: ?* Sepsis due to urinary tract infection (HCC) ?ESBL E. coli UTI with right-sided pyelonephritis: ?Patient presented with fever, tachycardia, tachypnea with CT findings of right-sided pyelonephritis due to ESBL E. coli UTI grown on culture collected 3/19.   ?CT renal stone study 3/21 negative for hydronephrosis or renal obstruction. ?Continue IV meropenem. ?Follow up blood cultures. ?Continue IV hydration. ?Continue Tylenol Toradol as needed for pain. ?Continue Zofran as needed for nausea and vomiting ? ?Hypokalemia ?Replaced and resolved. ? ? ?Subjective: Patient was seen and examined at bedside.  Overnight events noted. ?Both parents for at bedside.  Patient still reports having right flank pain but denies any nausea vomiting. ? ?Physical Exam: ?Vitals:  ? 09/28/21 2312 09/29/21 0458 09/29/21 0610 09/29/21 0807  ?BP: 119/72 111/68 109/70 108/78  ?Pulse: (!) 110 (!) 120 (!) 104 91  ?Resp: 16 17 18 18   ?Temp: 98 ?F (36.7 ?C) (!) 102 ?F (38.9 ?C) 100 ?F (37.8 ?C) 98.2 ?F (36.8 ?C)  ?TempSrc: Oral Oral Oral Oral  ?SpO2: 99% 92% 93% 98%  ?Weight:  67 kg    ? ?General exam: Appears comfortable, not in any acute distress. ?Respiratory system: CTA bilaterally, no wheezing,  no crackles, normal respiratory effort. ?Cardiovascular system: S1-S2 heard, regular rate rhythm, no murmur. ?Gastrointestinal system: .  Abdomen is  soft, nontender, nondistended, BS+ ?Central nervous system: Alert, oriented x 3, no focal neurological deficits. ?Back: CVA tenderness++ ?Extremities: No, no cyanosis, no clubbing. ?Psychiatry: Mood, insight, judgment normal. ? ?Data Reviewed: ?I have Reviewed nursing notes, Vitals, and Lab results since pt's last encounter. Pertinent lab results CBC, BMP, UA ?I have ordered test including CBC, BMP ?I have independently visualized and interpreted imaging CT renal which showed finding consistent with pyelonephritis. ?I have reviewed the last note from hospitalist,  ?I have discussed pt's care plan and test results with patient and family.  ? ?Family Communication: Both parents at bedside ? ?Disposition: ?Status is: Inpatient ?Remains inpatient appropriate because:  ?Admitted for sepsis secondary to pyelonephritis.  Requiring IV antibiotic ? ? ? Planned Discharge Destination: Home ? ? ? ? ?Time spent: 50 minutes ? ?Author: ? , MD ?09/29/2021 1:40 PM ? ?For on call review [email or web address].  ?

## 2021-09-29 NOTE — TOC Initial Note (Signed)
Transition of Care (TOC) - Initial/Assessment Note  ? ? ?Patient Details  ?Name: Luwam Currie ?MRN: [id number] ?Date of Birth: 1995/11/18 ? ?Transition of Care (TOC) CM/SW Contact:    ?Verdell Carmine, RN ?Phone Number: ?09/29/2021, 9:06 AM ? ?Clinical Narrative:                 ? ?The Transition of Care Department [facility]) has reviewed patient and no TOC needs have been identified at this time. We will continue to monitor patient advancement through interdisciplinary progression rounds. If new patient transition needs arise, please place a TOC consult ?  ?  ? ? ?Patient Goals and CMS Choice ?  ?  ?  ? ?Expected Discharge Plan and Services ?  ?  ?  ?  ?  ?                ?  ?  ?  ?  ?  ?  ?  ?  ?  ?  ? ?Prior Living Arrangements/Services ?  ?  ?  ?       ?  ?  ?  ?  ? ?Activities of Daily Living ?Home Assistive Devices/Equipment: None ?ADL Screening (condition at time of admission) ?Patient's cognitive ability adequate to safely complete daily activities?: Yes ?Is the patient deaf or have difficulty hearing?: No ?Does the patient have difficulty seeing, even when wearing glasses/contacts?: No ?Does the patient have difficulty concentrating, remembering, or making decisions?: No ?Patient able to express need for assistance with ADLs?: Yes ?Does the patient have difficulty dressing or bathing?: No ?Independently performs ADLs?: Yes (appropriate for developmental age) ?Does the patient have difficulty walking or climbing stairs?: No ?Weakness of Legs: None ?Weakness of Arms/Hands: None ? ?Permission Sought/Granted ?  ?  ?   ?   ?   ?   ? ?Emotional Assessment ?  ?  ?  ?  ?  ?  ? ?Admission diagnosis:  Pyelonephritis [N12] ?ESBL (extended spectrum beta-lactamase) producing bacteria infection [A49.9, Z16.12] ?Sepsis due to Escherichia coli (E. coli) ([place]) [A41.51] ?Sepsis, due to unspecified organism, unspecified whether acute organ dysfunction present ([place]) [A41.9] ?Patient Active Problem List  ? Diagnosis Date Noted  ?  Sepsis due to urinary tract infection ([place]) 09/28/2021  ? Hypokalemia 09/28/2021  ? Urinary tract infection due to extended-spectrum beta lactamase (ESBL) producing Escherichia coli 09/28/2021  ? ?PCP:  Pcp, No ?Pharmacy:   ?[facility] [id number] [place] [address] ?[place] ?[place] [address] ?Phone: [phone] Fax: [phone] ? ? ? ? ?Social Determinants of Health (SDOH) Interventions ?  ? ?Readmission Risk Interventions ?   ? View : No data to display.  ?  ?  ?  ? ? ? ?

## 2021-09-29 NOTE — Progress Notes (Signed)
Pt mews score became a red 4 after temp and hr elevated. Gave prn tylenol, cooled room and cold rag to head. Notified charge rn and j.daniels np. No new orders placed. Mews red protocol initiated \\VS  q1 x4. On 2nd round of vs pt is now a green as temp and hr have decreased. Cont to monitor ?

## 2021-09-30 DIAGNOSIS — D649 Anemia, unspecified: Secondary | ICD-10-CM

## 2021-09-30 DIAGNOSIS — N10 Acute tubulo-interstitial nephritis: Secondary | ICD-10-CM

## 2021-09-30 LAB — URINE CULTURE: Culture: NO GROWTH

## 2021-09-30 MED ORDER — SODIUM CHLORIDE 0.9 % IV SOLN
1.0000 g | INTRAVENOUS | Status: DC
  Administered 2021-09-30 – 2021-10-02 (×3): 1000 mg via INTRAVENOUS
  Filled 2021-09-30 (×3): qty 1

## 2021-09-30 NOTE — Progress Notes (Signed)
?  Progress Note ? ? ?Patient: Christy Thomas [id number] DOB: 1996-01-01 DOA: 09/28/2021     2 ?DOS: the patient was seen and examined on 09/30/2021 ?  ? ? ? ?Brief hospital course: ?Christy Thomas is a 26 y.o. F with no PMHx who presented with 2 weeks UTI symptoms, now several days flank pain, fever and nausea.   ? ? ?3/21: Initially seen in ER, CT renal unremarkable, discharged with cephalexin ?3/23: urine culture resulted with ESBL, called back to ER and admitted on Meropenem  ? ? ? ? ?Assessment and Plan: ?* Acute pyelonephritis ?ESBL E. coli UTI with right-sided pyelonephritis ?Met SIRS criteria but sepsis ruled out. ? ?-Continue metopenem ? ?Normocytic anemia ?- Follow up with PCP ? ?Hypokalemia ?Replaced and resolved. ? ? ? ? ?  ? ?Subjective: Still a lot of nausea, poor oral intake, she has some transient feeling out of breath, and some chest tenderness when palpating the chest, no vomiting, no confusion, mild fever this morning ? ?Physical Exam: ?Vitals:  ? 09/29/21 1558 09/29/21 2111 09/30/21 0818 09/30/21 1519  ?BP: 104/78 105/70 115/79 105/64  ?Pulse: 92 92 80 77  ?Resp: $Remov'18 17 16 19  'reaeNj$ ?Temp: 98.2 ?F (36.8 ?C) 99 ?F (37.2 ?C) (!) 100.7 ?F (38.2 ?C) 98.9 ?F (37.2 ?C)  ?TempSrc: Oral Oral Oral Oral  ?SpO2: 100% 97% 100% 99%  ?Weight:      ? ?Adult female, sitting up in bed, appears weak and tired, interactive, appropriate ?RRR, no murmurs, no lower extremity edema ?Respiratory rate normal, lungs clear without rales or wheezes ?Abdomen soft no tenderness palpation or guarding ?Attention normal, affect appropriate, judgment insight appear normal ? ? ?Data Reviewed: ?Discussed with infectious disease and pharmacy.  Nursing notes reviewed, vital signs reviewed ?Labs are notable for normalized potassium, normocytic anemia, no change, magnesium normal ? ?Family Communication: Mother and father at the bedside ? ?Disposition: ?Status is: Inpatient ?Remains inpatient appropriate because: She has not infection which  cannot be treated with oral antibiotics.  She will need continued IV antibiotic treatment here, when she is clinically improved, heart rate normalized, taking orals well, and IV access obtained we will plan to discharge for total of 10 days with ertapenem ? ? ? Planned Discharge Destination: Home with Home Health ? ? ? ?  ? ?Author: ?Edwin Dada, MD ?09/30/2021 4:11 PM ? ?For on call review [email or web address].  ?

## 2021-09-30 NOTE — Progress Notes (Signed)
PHARMACY CONSULT NOTE FOR: ? ?OUTPATIENT  PARENTERAL ANTIBIOTIC THERAPY (OPAT) ? ?Indication: ESBL UTI/Pyelonephritis ?Regimen: Ertapenem 1 gm every 24 hours ?End date: 10/07/21 ? ?IV antibiotic discharge orders are pended. ?To discharging provider:  please sign these orders via discharge navigator,  ?Select New Orders & click on the button choice - Manage This Unsigned Work.  ?  ? ?Thank you for allowing pharmacy to be a part of this patient's care. ? ?Sharin Mons, PharmD, BCPS, BCIDP ?Infectious Diseases Clinical Pharmacist ?Phone: [phone] ?09/30/2021, 2:34 PM ? ?

## 2021-09-30 NOTE — Assessment & Plan Note (Signed)
Follow up with PCP

## 2021-09-30 NOTE — TOC Progression Note (Signed)
Transition of Care (TOC) - Progression Note  ? ? ?Patient Details  ?Name: Christy Thomas ?MRN: [id number] ?Date of Birth: 1995-09-08 ? ?Transition of Care (TOC) CM/SW Contact  ?Marilu Favre, RN ?Phone Number: ?09/30/2021, 11:00 AM ? ?Clinical Narrative:    ? ?Discussed with MD, patient will stay inpatient for IV ABX  ? ? ?Transition of Care (TOC) Screening Note ? ? ?Patient Details  ?Name: Christy Thomas ?Date of Birth: Dec 02, 1995 ? ? ? ?Transition of Care Department [facility]) has reviewed patient and no TOC needs have been identified at this time. We will continue to monitor patient advancement through interdisciplinary progression rounds. If new patient transition needs arise, please place a TOC consult. ?  ? ?  ?  ? ?Expected Discharge Plan and Services ?  ?  ?  ?  ?  ?                ?  ?  ?  ?  ?  ?  ?  ?  ?  ?  ? ? ?Social Determinants of Health (SDOH) Interventions ?  ? ?Readmission Risk Interventions ?   ? View : No data to display.  ?  ?  ?  ? ? ?

## 2021-09-30 NOTE — TOC Progression Note (Addendum)
Transition of Care (TOC) - Progression Note  ? ? ?Patient Details  ?Name: Christy Thomas ?MRN: [id number] ?Date of Birth: Oct 27, 1995 ? ?Transition of Care (TOC) CM/SW Contact  ?Kingsley Plan, RN ?Phone Number: ?09/30/2021, 1:56 PM ? ?Clinical Narrative:    ? ?Pam with [employer] spoke to patient and mother at bedside. Explained IV ABX at home, answered questions, provided video link . Pam will come back tonight or tomorrow morning to provide hands on teaching  ? ? ?Spoke to patient and her parents at bedside.  ? ?Patient does not have PCP. Explained she can call number on insurance card and be provided .  ? ?NCM offered to to call [facility], patient in agreement. Next available is at [facility] at 2:30 pm . They will put her on cancellation call list. Information placed on AVS  ?Dr Maryfrances Bunnell will follow IV ABX post discharge.  ? ? ?Pam will arrange [facility]  ?Expected Discharge Plan: Home w Home Health Services ?Barriers to Discharge: Continued Medical Work up ? ?Expected Discharge Plan and Services ?Expected Discharge Plan: Home w Home Health Services ?  ?  ?  ?Living arrangements for the past 2 months: Single Family Home ?                ?  ?DME Agency: NA ?  ?  ?  ?HH Arranged: RN ?  ?  ?  ?  ? ? ?Social Determinants of Health (SDOH) Interventions ?  ? ?Readmission Risk Interventions ?   ? View : No data to display.  ?  ?  ?  ? ? ?

## 2021-10-01 ENCOUNTER — Inpatient Hospital Stay (HOSPITAL_COMMUNITY): Payer: BC Managed Care – PPO

## 2021-10-01 MED ORDER — SODIUM CHLORIDE 0.9% FLUSH
10.0000 mL | Freq: Two times a day (BID) | INTRAVENOUS | Status: DC
  Administered 2021-10-01 – 2021-10-02 (×2): 10 mL

## 2021-10-01 MED ORDER — SODIUM CHLORIDE 0.9% FLUSH: 10.0000 mL | INTRAVENOUS | Status: DC | PRN

## 2021-10-01 NOTE — TOC Progression Note (Signed)
Transition of Care (TOC) - Progression Note  ? ? ?Patient Details  ?Name: Christy Thomas ?MRN: [id number] ?Date of Birth: [birth year] ? ?Transition of Care (TOC) CM/SW Contact  ?Bess Kinds, RN ?Phone Number: [phone] ?10/01/2021, 10:55 AM ? ?Clinical Narrative:    ? ?Discussed plan for IV home infusion with Pam at [facility]. Patient is arranged for transition home after Sunday dose administered at hospital. [facility] arranged beginning Monday for [facility] RN. Discussed patient transitioning home today, but unable to accommodate change of plans for this weekend.  ? ?Expected Discharge Plan: Home w Home Health Services ?Barriers to Discharge: Continued Medical Work up ? ?Expected Discharge Plan and Services ?Expected Discharge Plan: Home w Home Health Services ?  ?  ?  ?Living arrangements for the past 2 months: Single Family Home ?                ?  ?DME Agency: NA ?  ?  ?  ?HH Arranged: RN ?  ?  ?  ?  ? ? ?Social Determinants of Health (SDOH) Interventions ?  ? ?Readmission Risk Interventions ?   ? View : No data to display.  ?  ?  ?  ? ? ?

## 2021-10-01 NOTE — Progress Notes (Signed)
?  Progress Note ? ? ?Patient: Christy Thomas [id number] DOB: [birth year] DOA: 09/28/2021     3 ?DOS: the patient was seen and examined on 10/01/2021 ?  ? ? ? ?Brief hospital course: ?Ms Boik is a 26 y.o. F with no PMHx who presented with 2 weeks UTI symptoms, now several days flank pain, fever and nausea.   ? ? ?  ? ? ? ? ?Assessment and Plan: ?* Acute pyelonephritis ?ESBL E. coli UTI with right-sided pyelonephritis ?Continue carbapenem ?OPAT has been arranged, [place] agency not able to start until Monday, so will need to keep an additional day for ertapenem tomorrow ? ?Normocytic anemia ?- Follow up with PCP ? ?Bloating, dyspnea ?Noting intermittent bloating and cough with dyspnea overnight.  No vomiting, no fever.  SpO2 normal. ?Chest x-ray personally reviewed, showsairspace disease or opacity ?Abdominal radiograph without evidence of obstruction or ileus. ? ? ?  ? ?Subjective: Oral intake improved, still has chest cough with deep inspiration, the feeling of being out of breath, this is accompanied by abdominal distention, all of these progressively worse over the course of the day, not exacerbated by exertion or food. ? ?Physical Exam: ?Vitals:  ? 09/30/21 1938 10/01/21 0614 10/01/21 0749 10/01/21 1547  ?BP: 112/79 119/84 123/81 110/85  ?Pulse: 81 79 79 71  ?Resp: 18 17 15 16   ?Temp: 98.3 ?F (36.8 ?C) 97.8 ?F (36.6 ?C) 98.9 ?F (37.2 ?C) 98.3 ?F (36.8 ?C)  ?TempSrc: Oral Oral Oral Oral  ?SpO2: 99% 98% 97% 100%  ?Weight:      ? ?Adult female, sitting up in bed, interactive, appropriate ?RRR, no murmurs, no peripheral edema, no JVD ?Lungs clear without rales or wheezes, good air movement ?Abdomen soft no tenderness palpation or guarding, bowel sounds normal, no CVA tenderness ?Attention normal, affect appropriate, judgment insight appear normal, face symmetric, speech fluent, moves all extremities with normal strength and coordination ? ? ?Data Reviewed: ?Discussed with pharmacy, nursing notes reviewed, vital signs  reviewed. ? ? ?Family Communication: Mother and father at the bedside ? ?Disposition: ?Status is: Inpatient ?Remains inpatient appropriate because: The patient has an infection with cannot be treated with oral antibiotics.  We have arranged for midline to be placed today, and ertapenem to start by outpatient parenteral antibiotic treatment starting on Monday. ? ?We will keep for 1 additional dose of ertapenem tomorrow, if she is doing well, home tomorrow after that ? ? ? Planned Discharge Destination: Home with Home Health ? ? ? ?  ? ?Author: ?Edwin Dada, MD ?10/01/2021 4:33 PM ? ?For on call review [email or web address].  ?

## 2021-10-01 NOTE — Progress Notes (Signed)
Pt c/o pain and discomfort around midline site. No swelling or redness noted when site was assessed. Midline flushes well with good blood return. IV team paged and waiting for response ?

## 2021-10-02 DIAGNOSIS — E876 Hypokalemia: Secondary | ICD-10-CM

## 2021-10-02 DIAGNOSIS — D649 Anemia, unspecified: Secondary | ICD-10-CM

## 2021-10-02 MED ORDER — ERTAPENEM IV (FOR PTA / DISCHARGE USE ONLY): 1.0000 g | INTRAVENOUS | 0 refills | Status: AC

## 2021-10-02 NOTE — Discharge Summary (Signed)
?Physician Discharge Summary ?  ?Patient: Christy Thomas MRN: [id number] DOB: 13-Mar-1996  ?Admit date:     09/28/2021  ?Discharge date: 10/02/21  ?Discharge Physician: Edwin Dada  ? ?PCP: Pcp, No  ? ?Recommendations at discharge:  ?Follow up with ID in 1 week for ESBL UTI ?Follow up with [facility] agency for ertapenem daily infusion for total 10 days ? ? ? ? ?Discharge Diagnoses: ?Principal Problem: ?  Acute pyelonephritis ?Active Problems: ?  Hypokalemia ?  Urinary tract infection due to extended-spectrum beta lactamase (ESBL) producing Escherichia coli ?  Normocytic anemia ? ? ? ? ? ? ? ?Hospital Course: ?Christy Thomas is a 26 y.o. F with no PMHx who presented with 2 weeks UTI symptoms, now several days flank pain, fever and nausea.   ? ?Two days prior to this admission, initially seen in ER, CT renal unremarkable, discharged with cephalexin ? ?3/23: Urine culture resulted with ESBL, called back to ER and admitted on Meropenem  ? ?Treated with meropenem, narrowed to ertapenem here.  Midline placed, discussed with ID who recommended 10 days total therapy, to complete on 3/31. ? ? ? ? ? ?  ? ?Pain control - [employer] Controlled Substance Reporting System database was reviewed.  ? ? ? ? ? ?Procedures performed: Midline IV placement  ?Disposition: Home ?Diet recommendation: Regular ? ?DISCHARGE MEDICATION: ?Allergies as of 10/02/2021   ?No Known Allergies ?  ? ?  ?Medication List  ?  ? ?STOP taking these medications   ? ?acyclovir ointment 5 % ?Commonly known as: ZOVIRAX ?  ?Aurovela 24 FE 1-20 MG-MCG(24) tablet ?Generic drug: Norethindrone Acetate-Ethinyl Estrad-FE ?  ?cephALEXin 500 MG capsule ?Commonly known as: Keflex ?  ?ibuprofen 600 MG tablet ?Commonly known as: ADVIL ?  ?norethindrone-ethinyl estradiol-FE 1-20 MG-MCG tablet ?Commonly known as: LOESTRIN FE ?  ?ondansetron 4 MG disintegrating tablet ?Commonly known as: ZOFRAN-ODT ?  ? ?  ? ?TAKE these medications   ? ?ertapenem   IVPB ?Commonly known as: INVANZ ?Inject 1 g into the vein daily for 7 days. Indication:  ESBL Pyelonephritis  ?First Dose: Yes ?Last Day of Therapy:  10/07/21 ?Labs - Once weekly:  CBC/D and BMP, ?Labs - Every other week:  ESR and CRP ?Method of administration: Mini-Bag Plus / Gravity ?Method of administration may be changed at the discretion of home infusion pharmacist based upon assessment of the patient and/or caregiver's ability to self-administer the medication ordered. ?  ?valACYclovir 1000 MG tablet ?Commonly known as: VALTREX ?Take 500 mg by mouth daily. ?  ? ?  ? ?  ?  ? ? ?  ?Discharge Care Instructions  ?(From admission, onward)  ?  ? ? ?  ? ?  Start     Ordered  ? 10/02/21 0000  Change dressing on IV access line weekly and PRN  (Home infusion instructions - [facility] )       ? 10/02/21 1245  ? ?  ?  ? ?  ? ? Follow-up Information   ? ? Ladell Pier, MD Follow up.   ?Specialty: Internal Medicine ?Why: November 03, 2021 at 2:30 pm . They will call if they have a cancellation sooner ?Contact information: ?[address] ?[address] ?[place] [address] ?[phone] ? ? ?  ?  ? ? Rosiland Oz, MD. Schedule an appointment as soon as possible for a visit in 2 week(s).   ?Specialty: Infectious Diseases ?Contact information: ?[address] ?[address] ?  [place] [address] ?[phone] ? ? ?  ?  ? ? [facility] Follow up.   ?Contact information: ?[address] ?[place] [address] ?[phone] ? ? ?  ?  ? ?  ?  ? ?  ? ?Discharge Instructions   ? ? [facility] pharmacist to adjust dose for Vancomycin, Aminoglycosides and other anti-infective therapies as requested by physician.   Complete by: As directed ?  ? [facility] to provide Cath Flo 69m   Complete by: As directed ?  ? Administer for PICC line occlusion and as ordered by physician for other access device issues.  ? Anaphylaxis Kit: Provided to treat any anaphylactic reaction to the  medication being provided to the patient if First Dose or when requested by physician   Complete by: As directed ?  ? Epinephrine 1927mml vial / amp: Administer 0.27m427m0.27ml79mubcutaneously once for moderate to severe anaphylaxis, nurse to call physician and pharmacy when reaction occurs and call 911 if needed for immediate care  ? Diphenhydramine 50mg38mIV vial: Administer 25-50mg 54mM PRN for first dose reaction, rash, itching, mild reaction, nurse to call physician and pharmacy when reaction occurs  ? Sodium Chloride 0.9% NS 500ml I54mdminister if needed for hypovolemic blood pressure drop or as ordered by physician after call to physician with anaphylactic reaction  ? Change dressing on IV access line weekly and PRN   Complete by: As directed ?  ? Discharge instructions   Complete by: As directed ?  ? From Dr. DanfordLoleta Bookswere admitted for a kidney infection (pyelonephritis). ?It turned out this was from a resistant bacteria called "ESBL E coli" ?You were treated here with meropenem and ertapenem. ?You should complete treatment with 5 more days ertapenem ?If you take your dose today at 2pm, you could take your Monday dose at 1 or 2pm and your Tuesday dose at noon, to avoid conflict with your classes. ?(Adjusting the timing of the dose by 1 or 2 hours will have minimal impact on the effectiveness) ? ?Call the Infectious Disease clinic (listed below under Dr. ManandhLevonne Spillern the To Do section) ?Let them know you were admitted for an infection, your doctor spoke with Dr. ManandhWest Balihat you were supposed to get follow up with one of their doctors in 7-10 days ? ?As we discussed, if you can't adjust your flight Friday, it is okay to keep the IV for an extra three days [place], it would be not unreasonable to take the dose Thursday and be done.  It's hard to say if that would reduce the effectiveness of the treatment, but the chances are low. ? ?Our staff did set up an  appointment with you for a new primary care doctor.  ?[facility]  You can keep that appointment or call to cancel it and try to find a regular primary care, [facility] example [facility] [place] access with Sodium Chloride 0.9% and Heparin 10 units/ml or 100 units/ml   Complete by: As directed ?  ? Home infusion instructions - [facility]  Remove midline at conclusion of therapy.   Complete by: As directed ?  ? Remove midline at conclusion of therapy.  ? Instructions: Flush IV access with Sodium Chloride 0.9% and Heparin 10units/ml or 100units/ml  ? Change dressing on IV access line: Weekly  and PRN  ? Instructions Cath Flo 40m: Administer for PICC Line occlusion and as ordered by physician for other access device  ? [facility] pharmacist to adjust dose for: Vancomycin, Aminoglycosides and other anti-infective therapies as requested by physician  ? Increase activity slowly   Complete by: As directed ?  ? Method of administration may be changed at the discretion of home infusion pharmacist based upon assessment of the patient and/or caregiver?s ability to self-administer the medication ordered   Complete by: As directed ?  ? ?  ? ? ?Discharge Exam: ?Filed Weights  ? 09/29/21 0458  ?Weight: 67 kg  ? ?General: Pt is alert, awake, not in acute distress ?Cardiovascular: RRR, nl S1-S2, no murmurs appreciated.   No LE edema.   ?Respiratory: Normal respiratory rate and rhythm.  CTAB without rales or wheezes. ?Abdominal: Abdomen soft and non-tender.  No distension or HSM.   ?Neuro/Psych: Strength symmetric in upper and lower extremities.  Judgment and insight appear normal. ? ? ?Condition at discharge: good ? ?The results of significant diagnostics from this hospitalization (including imaging, microbiology, ancillary and laboratory) are listed below for reference.  ? ?Imaging Studies: ?DG Chest Port 1 View ? ?Result  Date: 09/28/2021 ?CLINICAL DATA:  Question sepsis.  Fevers and chills. EXAM: PORTABLE CHEST 1 VIEW COMPARISON:  None. FINDINGS: The heart size and mediastinal contours are within normal limits. Both lungs are cl

## 2021-10-02 NOTE — Progress Notes (Signed)
Pt discharged home with parents in stable condition after going over discharge teaching with no concerns voiced  ?

## 2021-10-02 NOTE — TOC Transition Note (Signed)
Transition of Care (TOC) - CM/SW Discharge Note ? ? ?Patient Details  ?Name: Christy Thomas ?MRN: [id number] ?Date of Birth: 1996-07-04 ? ?Transition of Care (TOC) CM/SW Contact:  ?Bess Kinds, RN ?Phone Number: [phone] ?10/02/2021, 2:35 PM ? ? ?Clinical Narrative:    ? ?Patient to transition home today. Contacted Pam with [facility]. Pam has been in contact with patient's mom to remind of IV antibiotic delivery tomorrow afternoon and [facility] RN visit between 4-5 tomorrow.  ? ?Final next level of care: Home w Home Health Services ?Barriers to Discharge: No Barriers Identified ? ? ?Patient Goals and CMS Choice ?Patient states their goals for this hospitalization and ongoing recovery are:: to return to home ?CMS Medicare.gov Compare Post Acute Care list provided to:: Patient ?  ? ?Discharge Placement ?  ?           ?  ?  ?  ?  ? ?Discharge Plan and Services ?  ?  ?           ?  ?DME Agency: NA ?  ?  ?  ?HH Arranged: IV Antibiotics ?HH Agency: [facility] ?Date HH Agency Contacted: 10/02/21 ?Time HH Agency Contacted: 1435 ?Representative spoke with at [facility] Agency: Pam ? ?Social Determinants of Health (SDOH) Interventions ?  ? ? ?Readmission Risk Interventions ?   ? View : No data to display.  ?  ?  ?  ? ? ? ? ? ?

## 2021-10-03 LAB — CULTURE, BLOOD (ROUTINE X 2)
Culture: NO GROWTH
Culture: NO GROWTH
Special Requests: ADEQUATE
Special Requests: ADEQUATE

## 2021-10-07 ENCOUNTER — Inpatient Hospital Stay: Payer: BC Managed Care – PPO | Admitting: Infectious Diseases

## 2021-10-18 ENCOUNTER — Encounter: Payer: Self-pay | Admitting: Infectious Diseases

## 2021-10-18 ENCOUNTER — Inpatient Hospital Stay: Payer: BC Managed Care – PPO | Admitting: Infectious Diseases

## 2021-10-18 ENCOUNTER — Other Ambulatory Visit: Payer: Self-pay

## 2021-10-18 ENCOUNTER — Ambulatory Visit: Payer: BC Managed Care – PPO | Admitting: Infectious Diseases

## 2021-10-18 VITALS — BP 106/76 | HR 85 | Temp 98.9°F | Ht 61.0 in | Wt 136.0 lb

## 2021-10-18 DIAGNOSIS — N10 Acute tubulo-interstitial nephritis: Secondary | ICD-10-CM

## 2021-10-18 DIAGNOSIS — A499 Bacterial infection, unspecified: Secondary | ICD-10-CM | POA: Diagnosis not present

## 2021-10-18 DIAGNOSIS — Z452 Encounter for adjustment and management of vascular access device: Secondary | ICD-10-CM | POA: Diagnosis not present

## 2021-10-18 DIAGNOSIS — Z1612 Extended spectrum beta lactamase (ESBL) resistance: Secondary | ICD-10-CM

## 2021-10-18 NOTE — Progress Notes (Signed)
? ?   ? ? ? ? ?Patient Active Problem List  ? Diagnosis Date Noted  ? Normocytic anemia 09/30/2021  ? Acute pyelonephritis 09/28/2021  ? Hypokalemia 09/28/2021  ? Urinary tract infection due to extended-spectrum beta lactamase (ESBL) producing Escherichia coli 09/28/2021  ? ? ?Patient's Medications  ?New Prescriptions  ? No medications on file  ?Previous Medications  ? VALACYCLOVIR (VALTREX) 1000 MG TABLET    Take 500 mg by mouth daily.  ?Modified Medications  ? No medications on file  ?Discontinued Medications  ? No medications on file  ? ? ?Subjective: ?26 y.o. F with no PMHx who is here for HFU for pyelonephritis. Seen in the ED 3/19 for dysuria when Macrobid 5-day course prescribed. UA with large leukocytes and positive nitrites. Urine cx eventually grew ESBL E coli. Seen in the ED 3/21 for fevers and flank pain.  CT with signs of pyelonephritis. UA with moderate leukocytes. Sent home on cephalexin  ?Called back on 3/22 as urine cx 3/19 grew ESBL Ecoli. Admitted 3/22-3/26 for pyelonephritis. Treated with 10 days course of IV meropenem and ertapenem total via midline. Last dose 10/07/21. Denies fevers, chills. Denies GU symptoms. Midline has been removed. She tells me she has had frequent UTIs in the past but never took abtx which is surprising as she has grown ESBL from her urine cx.  ? ?Review of Systems: ?ROS all systems reviewed and negative except as stated above  ? ?No past medical history on file. ? ?No past surgical history on file. ? ? ?Social History  ? ?Tobacco Use  ? Smoking status: Never  ? Smokeless tobacco: Never  ?Substance Use Topics  ? Alcohol use: Yes  ?  Alcohol/week: 3.0 standard drinks  ?  Types: 3 Shots of liquor per week  ? Drug use: Yes  ?  Types: Marijuana  ? ? ?Family History  ?Problem Relation Age of Onset  ? Lymphoma Mother   ? ? ?No Known Allergies ? ?Health Maintenance  ?Topic Date Due  ? HPV VACCINES (1 - 2-dose series) Never done  ? Hepatitis C Screening  Never done  ?  TETANUS/TDAP  Never done  ? PAP-Cervical Cytology Screening  Never done  ? PAP SMEAR-Modifier  Never done  ? INFLUENZA VACCINE  02/07/2022  ? HIV Screening  Completed  ? ? ?Objective: ?BP 106/76   Pulse 85   Temp 98.9 ?F (37.2 ?C) (Oral)   Ht 5\' 1"  (1.549 m)   Wt 136 lb (61.7 kg)   LMP 09/25/2021 (Approximate)   SpO2 99%   BMI 25.70 kg/m?  ? ? ?Physical Exam ?Constitutional:   ?   Appearance: Normal appearance.  ?HENT:  ?   Head: Normocephalic and atraumatic.   ?   Mouth: Mucous membranes are moist.  ?Eyes: ?   Conjunctiva/sclera: Conjunctivae normal.  ?   Pupils: Pupils are equal, round ? ?Cardiovascular:  ?   Rate and Rhythm: Normal rate and regular rhythm.  ?   Heart sounds:  ? ?Pulmonary:  ?   Effort: Pulmonary effort is normal.  ?   Breath sounds: Normal breath sounds.  ? ?Abdominal:  ?   General: Non distended  ?   Palpations: soft.  ? ?Musculoskeletal:     ?   General: Normal range of motion.  ? ?Skin: ?   General: Skin is warm and dry.  ?   Comments: ? ?Neurological:  ?   General: grossly non focal  ?   Mental Status:  awake, alert and oriented to person, place, and time.  ? ?Psychiatric:     ?   Mood and Affect: Mood normal.  ? ?Lab Results ?Lab Results  ?Component Value Date  ? WBC 5.4 09/29/2021  ? HGB 10.0 (L) 09/29/2021  ? HCT 29.8 (L) 09/29/2021  ? MCV 90.0 09/29/2021  ? PLT 286 09/29/2021  ?  ?Lab Results  ?Component Value Date  ? CREATININE 0.82 09/29/2021  ? BUN 6 09/29/2021  ? NA 137 09/29/2021  ? K 3.6 09/29/2021  ? CL 108 09/29/2021  ? CO2 23 09/29/2021  ?  ?Lab Results  ?Component Value Date  ? ALT 20 09/28/2021  ? AST 19 09/28/2021  ? ALKPHOS 61 09/28/2021  ? BILITOT 0.7 09/28/2021  ?  ?No results found for: CHOL, HDL, LDLCALC, LDLDIRECT, TRIG, CHOLHDL ?No results found for: LABRPR, RPRTITER ?No results found for: HIV1RNAQUANT, HIV1RNAVL, CD4TABS ? ?Imaging ?DG Chest Port 1 View ? ?Result Date: 09/28/2021 ?CLINICAL DATA:  Question sepsis.  Fevers and chills. EXAM: PORTABLE CHEST 1 VIEW  COMPARISON:  None. FINDINGS: The heart size and mediastinal contours are within normal limits. Both lungs are clear. The visualized skeletal structures are unremarkable. IMPRESSION: No active disease. Electronically Signed   By: Paulina Fusi M.D.   On: 09/28/2021 12:08  ? ?DG ABD ACUTE 2+V W 1V CHEST ? ?Result Date: 10/01/2021 ?CLINICAL DATA:  Difficulty breathing, bloating. EXAM: DG ABDOMEN ACUTE WITH 1 VIEW CHEST COMPARISON:  CT abdomen pelvis dated 09/27/2021. FINDINGS: There is no evidence of dilated bowel loops or free intraperitoneal air. No radiopaque calculi or other significant radiographic abnormality is seen. Heart size and mediastinal contours are within normal limits. Both lungs are clear. IMPRESSION: Negative abdominal radiographs.  No acute cardiopulmonary disease. Electronically Signed   By: Romona Curls M.D.   On: 10/01/2021 14:18  ? ?CT Renal Stone Study ? ?Result Date: 09/27/2021 ?CLINICAL DATA:  Acute right flank pain. EXAM: CT ABDOMEN AND PELVIS WITHOUT CONTRAST TECHNIQUE: Multidetector CT imaging of the abdomen and pelvis was performed following the standard protocol without IV contrast. RADIATION DOSE REDUCTION: This exam was performed according to the departmental dose-optimization program which includes automated exposure control, adjustment of the mA and/or kV according to patient size and/or use of iterative reconstruction technique. COMPARISON:  None. FINDINGS: Lower chest: No acute abnormality. Hepatobiliary: No focal liver abnormality is seen. No gallstones, gallbladder wall thickening, or biliary dilatation. Pancreas: Unremarkable. No pancreatic ductal dilatation or surrounding inflammatory changes. Spleen: Normal in size without focal abnormality. Adrenals/Urinary Tract: Adrenal glands are unremarkable. No renal or ureteral calculi are noted. No hydronephrosis or renal obstruction is noted. Urinary bladder is unremarkable. Mild inflammatory changes are noted around the right kidney  concerning for possible pyelonephritis. Stomach/Bowel: Stomach is within normal limits. Appendix appears normal. No evidence of bowel wall thickening, distention, or inflammatory changes. Vascular/Lymphatic: No significant vascular findings are present. No enlarged abdominal or pelvic lymph nodes. Reproductive: Uterus and bilateral adnexa are unremarkable. Other: No hernia is noted. Small amount of free fluid is noted in the pelvis which may be physiologic. Musculoskeletal: No acute or significant osseous findings. IMPRESSION: Findings concerning for right pyelonephritis. No hydronephrosis or renal obstruction is noted. Electronically Signed   By: Lupita Raider M.D.   On: 09/27/2021 12:01   ? ?Problem List Items Addressed This Visit   ? ?  ? Genitourinary  ? Acute pyelonephritis - Primary  ?  ? Other  ? ESBL (extended spectrum beta-lactamase) producing bacteria  infection  ? PICC (peripherally inserted central catheter) in place  ? ?Assessment/Plan ?# Pyelonephritis  ?Has completed 10 days of treatment with IV meropenem and ertapenem with resolution of symptoms  ?Discussed about adequate fluid intake and good genital hygiene to avoid future episodes.  ? ?# Midline - removed,site looks OK ? ?I have personally spent 60 minutes involved in face-to-face and non-face-to-face activities for this patient on the day of the visit. Professional time spent includes the following activities: Preparing to see the patient (review of tests), Obtaining and/or reviewing separately obtained history (admission/discharge record), Performing a medically appropriate examination and/or evaluation , Ordering medications/tests/procedures, referring and communicating with other health care professionals, Documenting clinical information in the EMR, Independently interpreting results (not separately reported), Communicating results to the patient/family/caregiver, Counseling and educating the patient/family/caregiver and Care coordination  (not separately reported).  ? ?Victoriano Lain, MD ?[facility] ?[facility] ?10/18/2021, 10:18 AM ? ?

## 2021-11-03 ENCOUNTER — Ambulatory Visit: Payer: BC Managed Care – PPO | Attending: Internal Medicine | Admitting: Internal Medicine

## 2021-11-03 ENCOUNTER — Encounter: Payer: Self-pay | Admitting: Internal Medicine

## 2021-11-03 VITALS — BP 107/67 | HR 82 | Wt 138.0 lb

## 2021-11-03 DIAGNOSIS — Z87440 Personal history of urinary (tract) infections: Secondary | ICD-10-CM

## 2021-11-03 DIAGNOSIS — Z23 Encounter for immunization: Secondary | ICD-10-CM

## 2021-11-03 DIAGNOSIS — Z09 Encounter for follow-up examination after completed treatment for conditions other than malignant neoplasm: Secondary | ICD-10-CM

## 2021-11-03 NOTE — Progress Notes (Signed)
? ? ?Patient ID: Christy Thomas, female    DOB: [birth year]  MRN: [id number] ? ?CC: Hospitalization Follow-up ? ? ?Subjective: ?Christy Thomas is a 26 y.o. female who presents for hosp f/u/new pt visit ?Her concerns today include:  ? ?This patient presents new for hospital follow-up.  Patient was hospitalized 3/22-26/2023 with pyelonephritis due to ESBL E.coli.  Prior to this, she was seen in ER x 2 several days prior for UTI and discgh on oral abx.  Urine Cx pos for ESBL E.Coli.  pt called back and admitted.  Treated with IV meropenem and ertapenem.  She was followed by infectious disease. ?Castana with PICC line for 3-4 more days to complete 10 days of IV abx.   ?She ahs completed abx and has seen ID Dr. West Bali already for out-pt f/u. ?No recurrence of symptoms. ?No fever or back pain at this time.  ? ?HM: Due for Tdap vaccine.  Has not had HPV vaccine series.  She states that she already has HPV.  Due for Pap smear which she states she will have done through her gynecologist at [facility].  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 ? ?Patient Active Problem List  ? Diagnosis Date Noted  ? ESBL (extended spectrum beta-lactamase) producing bacteria infection 10/18/2021  ? PICC (peripherally inserted central catheter) in place 10/18/2021  ? Normocytic anemia 09/30/2021  ? Acute pyelonephritis 09/28/2021  ? Hypokalemia 09/28/2021  ? Urinary tract infection due to extended-spectrum beta lactamase (ESBL) producing Escherichia coli 09/28/2021  ?  ? ?Current Outpatient Medications on File Prior to Visit  ?Medication Sig Dispense Refill  ? JUNEL FE 24 1-20 MG-MCG(24) tablet Take 1 tablet by mouth daily.    ? valACYclovir (VALTREX) 1000 MG tablet Take 500 mg by mouth daily.    ? ?No current facility-administered medications on file prior to visit.  ? ? ?No Known Allergies ? ?Social History  ? ?Socioeconomic History  ? Marital status: Single  ?  Spouse name: Not on file  ? Number of children: 0  ?  Years of education: Not on file  ? Highest education level: Not on file  ?Occupational History  ? Not on file  ?Tobacco Use  ? Smoking status: Never  ? Smokeless tobacco: Never  ?Vaping Use  ? Vaping Use: Never used  ?Substance and Sexual Activity  ? Alcohol use: Yes  ?  Alcohol/week: 3.0 standard drinks  ?  Types: 3 Shots of liquor per week  ?  Comment: less than once a wk  ? Drug use: Not Currently  ?  Types: Marijuana  ? Sexual activity: Not on file  ?Other Topics Concern  ? Not on file  ?Social History Narrative  ? Not on file  ? ?Social Determinants of Health  ? ?Financial Resource Strain: Not on file  ?Food Insecurity: Not on file  ?Transportation Needs: Not on file  ?Physical Activity: Not on file  ?Stress: Not on file  ?Social Connections: Not on file  ?Intimate Partner Violence: Not on file  ? ? ?Family History  ?Problem Relation Age of Onset  ? Lymphoma Mother   ? ? ?History reviewed. No pertinent surgical history. ? ?ROS: ?Review of Systems ?Negative except as stated above ? ?PHYSICAL EXAM: ?BP 107/67   Pulse 82   Wt 138 lb (62.6 kg)   SpO2 96%   BMI 26.07 kg/m?   ?Physical Exam ? ?General appearance - alert, well appearing, young Caucasian female and in no distress ?Mental status -  normal mood, behavior, speech, dress, motor activity, and thought processes ?Chest - clear to auscultation, no wheezes, rales or rhonchi, symmetric air entry ?Heart - normal rate, regular rhythm, normal S1, S2, no murmurs, rubs, clicks or gallops ? ? ? ?  Latest Ref Rng & Units 09/29/2021  ?  5:33 AM 09/28/2021  ? 12:05 PM 09/27/2021  ?  9:52 AM  ?CMP  ?Glucose 70 - 99 mg/dL 97   106   109    ?BUN 6 - 20 mg/dL 6   7   8     ?Creatinine 0.44 - 1.00 mg/dL 0.82   0.94   0.90    ?Sodium 135 - 145 mmol/L 137   136   134    ?Potassium 3.5 - 5.1 mmol/L 3.6   3.0   3.3    ?Chloride 98 - 111 mmol/L 108   103   100    ?CO2 22 - 32 mmol/L 23   23   24     ?Calcium 8.9 - 10.3 mg/dL 8.1   8.9   9.0    ?Total Protein 6.5 - 8.1 g/dL  7.2    7.2    ?Total Bilirubin 0.3 - 1.2 mg/dL  0.7   0.9    ?Alkaline Phos 38 - 126 U/L  61   57    ?AST 15 - 41 U/L  19   15    ?ALT 0 - 44 U/L  20   17    ? ?Lipid Panel  ?No results found for: CHOL, TRIG, HDL, CHOLHDL, VLDL, LDLCALC, LDLDIRECT ? ?CBC ?   ?Component Value Date/Time  ? WBC 5.4 09/29/2021 0533  ? RBC 3.31 (L) 09/29/2021 0533  ? HGB 10.0 (L) 09/29/2021 0533  ? HCT 29.8 (L) 09/29/2021 0533  ? PLT 286 09/29/2021 0533  ? MCV 90.0 09/29/2021 0533  ? MCH 30.2 09/29/2021 0533  ? MCHC 33.6 09/29/2021 0533  ? RDW 14.0 09/29/2021 0533  ? LYMPHSABS 1.2 09/28/2021 1205  ? MONOABS 1.6 (H) 09/28/2021 1205  ? EOSABS 0.0 09/28/2021 1205  ? BASOSABS 0.0 09/28/2021 1205  ? ? ?ASSESSMENT AND PLAN: ?[facility] discharge follow-up ?2. History of recurrent UTI (urinary tract infection) ?Patient has completed treatment for pyelonephritis that was due to a resistant strain of E. coli.  Symptoms have since resolved. ? ?3. Need for Tdap vaccination ?Patient agreeable to receiving Tdap vaccine today.  Advised that it can cause some soreness at the injection site for several days. ? ?Encouraged her to get the HPV vaccine series even though she has already been diagnosed with HPV.  Encouraged her to discuss with her gynecologist when she goes to have her Pap smear. ? ? ? ?Patient was given the opportunity to ask questions.  Patient verbalized understanding of the plan and was able to repeat key elements of the plan.  ? ?This documentation was completed using Radio producer.  Any transcriptional errors are unintentional. ? ?Orders Placed This Encounter  ?Procedures  ? Tdap vaccine greater than or equal to 7yo IM  ? ? ? ?Requested Prescriptions  ? ? No prescriptions requested or ordered in this encounter  ? ? ?No follow-ups on file. ? ?Karle Plumber, MD, FACP ?

## 2021-11-23 ENCOUNTER — Ambulatory Visit (INDEPENDENT_AMBULATORY_CARE_PROVIDER_SITE_OTHER): Payer: BC Managed Care – PPO | Admitting: Family Medicine

## 2021-11-23 ENCOUNTER — Encounter: Payer: Self-pay | Admitting: Family Medicine

## 2021-11-23 VITALS — BP 108/76 | HR 104 | Temp 98.2°F | Ht 61.0 in | Wt 135.0 lb

## 2021-11-23 DIAGNOSIS — F410 Panic disorder [episodic paroxysmal anxiety] without agoraphobia: Secondary | ICD-10-CM | POA: Diagnosis not present

## 2021-11-23 DIAGNOSIS — D649 Anemia, unspecified: Secondary | ICD-10-CM

## 2021-11-23 DIAGNOSIS — R591 Generalized enlarged lymph nodes: Secondary | ICD-10-CM | POA: Diagnosis not present

## 2021-11-23 DIAGNOSIS — F3181 Bipolar II disorder: Secondary | ICD-10-CM | POA: Diagnosis not present

## 2021-11-23 DIAGNOSIS — J029 Acute pharyngitis, unspecified: Secondary | ICD-10-CM | POA: Diagnosis not present

## 2021-11-23 DIAGNOSIS — Z113 Encounter for screening for infections with a predominantly sexual mode of transmission: Secondary | ICD-10-CM

## 2021-11-23 DIAGNOSIS — J0391 Acute recurrent tonsillitis, unspecified: Secondary | ICD-10-CM

## 2021-11-23 DIAGNOSIS — F419 Anxiety disorder, unspecified: Secondary | ICD-10-CM | POA: Diagnosis not present

## 2021-11-23 LAB — CBC WITH DIFFERENTIAL/PLATELET
Basophils Absolute: 0 10*3/uL (ref 0.0–0.1)
Basophils Relative: 0.6 % (ref 0.0–3.0)
Eosinophils Absolute: 0.1 10*3/uL (ref 0.0–0.7)
Eosinophils Relative: 1.5 % (ref 0.0–5.0)
HCT: 38.5 % (ref 36.0–46.0)
Hemoglobin: 13 g/dL (ref 12.0–15.0)
Lymphocytes Relative: 15.8 % (ref 12.0–46.0)
Lymphs Abs: 1.4 10*3/uL (ref 0.7–4.0)
MCHC: 33.8 g/dL (ref 30.0–36.0)
MCV: 90.2 fl (ref 78.0–100.0)
Monocytes Absolute: 0.8 10*3/uL (ref 0.1–1.0)
Monocytes Relative: 8.5 % (ref 3.0–12.0)
Neutro Abs: 6.5 10*3/uL (ref 1.4–7.7)
Neutrophils Relative %: 73.6 % (ref 43.0–77.0)
Platelets: 341 10*3/uL (ref 150.0–400.0)
RBC: 4.27 Mil/uL (ref 3.87–5.11)
RDW: 13.7 % (ref 11.5–15.5)
WBC: 8.9 10*3/uL (ref 4.0–10.5)

## 2021-11-23 LAB — COMPREHENSIVE METABOLIC PANEL
ALT: 13 U/L (ref 0–35)
AST: 16 U/L (ref 0–37)
Albumin: 4.2 g/dL (ref 3.5–5.2)
Alkaline Phosphatase: 55 U/L (ref 39–117)
BUN: 6 mg/dL (ref 6–23)
CO2: 24 mEq/L (ref 19–32)
Calcium: 9.4 mg/dL (ref 8.4–10.5)
Chloride: 105 mEq/L (ref 96–112)
Creatinine, Ser: 0.81 mg/dL (ref 0.40–1.20)
GFR: 101.97 mL/min (ref >60.00)
Glucose, Bld: 80 mg/dL (ref 70–99)
Potassium: 3.6 mEq/L (ref 3.5–5.1)
Sodium: 138 mEq/L (ref 135–145)
Total Bilirubin: 0.4 mg/dL (ref 0.2–1.2)
Total Protein: 7.8 g/dL (ref 6.0–8.3)

## 2021-11-23 LAB — T4, FREE: Free T4: 1.43 ng/dL (ref 0.60–1.60)

## 2021-11-23 LAB — VITAMIN B12: Vitamin B-12: 233 pg/mL (ref 211–911)

## 2021-11-23 LAB — POCT RAPID STREP A (OFFICE): Rapid Strep A Screen: NEGATIVE

## 2021-11-23 LAB — TSH: TSH: 0.73 u[IU]/mL (ref 0.35–5.50)

## 2021-11-23 NOTE — Progress Notes (Signed)
? ?New Patient Office Visit ? ?Subjective   ? ?Patient ID: Christy Thomas, female    DOB: May 05, 1996  Age: 26 y.o. MRN: [id number] ? ?CC:  ?Chief Complaint  ?Patient presents with  ? Establish Care  ?  4x a year throat issues. Both lymph nodes swell and make it hard to swallow and would like to discuss  ? ? ?HPI ?Christy Thomas presents to establish care.  ?OB/GYN- [facility] ? ?Concern today is recurrent tonsillitis, approx 4 x per year.  ? ?She also reports having panic attacks over the past 4 months. States she has a hx of anxiety and recently diagnosed with Bipolar 2, depression.   ?Smokes marijuana at times and uses alcohol occasionally.  ? ?Denies taking medication for anxiety, bipolar or panic attacks in the past.  ?States she is seeing a sex therapist at [facility]. She diagnosed her with Bipolar.  ? ?Would like STI testing. Asymptomatic.  ? ?Reports having a stressful 4 months. Recent illness and school stress. Her boyfriend left her and moved to [place]. She was diagnosed with genital herpes. States her car broke down.  ?States she had a stalker but this is no longer an issue.  ? ?She is a Dietitian, Nature conservation officer.  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  ? ?States she is close to her mom and stepdad. Has friends.  ? ?Anemia- light periods.  ?LMP: one week ago.  ?On OCPs and reports good compliance.  ? ?Avoids caffeine.  ? ?Recent hospitalization for sepsis, acute pyelonephritis due to ESBL producing organism.  ?PICC Line removed.  ?Reports being back to her baseline.  ? ? ? ?  11/23/2021  ? 10:12 AM 11/03/2021  ?  2:42 PM 10/18/2021  ? 10:32 AM  ?Depression screen PHQ 2/9  ?Decreased Interest 0 0 0  ?Down, Depressed, Hopeless 0 1 0  ?PHQ - 2 Score 0 1 0  ? ? ? ?  ? ?Outpatient Encounter Medications as of 11/23/2021  ?Medication Sig  ? JUNEL FE 24 1-20 MG-MCG(24) tablet Take 1 tablet by mouth daily.  ? valACYclovir (VALTREX) 1000 MG tablet Take 500 mg by mouth daily.  ? ?No  facility-administered encounter medications on file as of 11/23/2021.  ? ? ?History reviewed. No pertinent past medical history. ? ?History reviewed. No pertinent surgical history. ? ?Family History  ?Problem Relation Age of Onset  ? Lymphoma Mother   ? ? ?Social History  ? ?Socioeconomic History  ? Marital status: Single  ?  Spouse name: Not on file  ? Number of children: 0  ? Years of education: Not on file  ? Highest education level: Not on file  ?Occupational History  ? Not on file  ?Tobacco Use  ? Smoking status: Never  ? Smokeless tobacco: Never  ?Vaping Use  ? Vaping Use: Never used  ?Substance and Sexual Activity  ? Alcohol use: Yes  ?  Alcohol/week: 3.0 standard drinks  ?  Types: 3 Shots of liquor per week  ?  Comment: less than once a wk  ? Drug use: Not Currently  ?  Types: Marijuana  ? Sexual activity: Not on file  ?Other Topics Concern  ? Not on file  ?Social History Narrative  ? Not on file  ? ?Social Determinants of Health  ? ?Financial Resource Strain: Not on file  ?Food Insecurity: Not on file  ?Transportation Needs: Not on file  ?Physical Activity: Not on file  ?Stress: Not on file  ?Social  Connections: Not on file  ?Intimate Partner Violence: Not on file  ? ? ?ROS ?Pertinent positives and negatives in the history of present illness. ? ?  ? ? ?Objective   ? ?BP 108/76 (BP Location: Left Arm, Patient Position: Sitting, Cuff Size: Large)   Pulse (!) 104   Temp 98.2 ?F (36.8 ?C) (Temporal)   Ht  (1.549 m)   Wt 135 lb (61.2 kg)   SpO2 99%   BMI 25.51 kg/m?  ? ?Physical Exam ?Constitutional:   ?   General: She is not in acute distress. ?   Appearance: She is not ill-appearing.  ?HENT:  ?   Mouth/Throat:  ?   Lips: Pink.  ?   Mouth: Mucous membranes are moist.  ?   Pharynx: Posterior oropharyngeal erythema present. No oropharyngeal exudate or uvula swelling.  ?   Tonsils: No tonsillar exudate. 3+ on the right. 3+ on the left.  ?Eyes:  ?   Conjunctiva/sclera: Conjunctivae normal.  ?   Pupils:  Pupils are equal, round, and reactive to light.  ?Cardiovascular:  ?   Rate and Rhythm: Normal rate and regular rhythm.  ?Pulmonary:  ?   Effort: Pulmonary effort is normal.  ?   Breath sounds: Normal breath sounds.  ?Musculoskeletal:  ?   Cervical back: Normal range of motion and neck supple.  ?Lymphadenopathy:  ?   Cervical: Cervical adenopathy present.  ?Skin: ?   General: Skin is warm and dry.  ?Neurological:  ?   Mental Status: She is alert and oriented to person, place, and time.  ?   Cranial Nerves: Cranial nerves 2-12 are intact.  ?   Motor: Motor function is intact.  ?   Coordination: Coordination is intact.  ?   Gait: Gait is intact.  ?Psychiatric:     ?   Attention and Perception: Attention normal.     ?   Mood and Affect: Mood normal.     ?   Speech: Speech normal.     ?   Behavior: Behavior normal.     ?   Thought Content: Thought content normal.     ?   Cognition and Memory: Cognition normal.  ? ? ? ?  ? ?Assessment & Plan:  ? ?Problem List Items Addressed This Visit   ? ?  ? Respiratory  ? Acute pharyngitis  ? Relevant Orders  ? Ambulatory referral to ENT  ? POCT rapid strep A (Completed)  ? CBC with Differential/Platelet (Completed)  ? Recurrent tonsillitis - Primary  ?  Rapid strep test negative. Recommend symptomatic treatment. Referral to ENT ? ?  ?  ? Relevant Orders  ? Ambulatory referral to ENT  ? POCT rapid strep A (Completed)  ?  ? Immune and Lymphatic  ? Lymphadenopathy  ?  Due to current tonsillitis. Follow up if not improving.  ? ?  ?  ? Relevant Orders  ? POCT rapid strep A (Completed)  ? CBC with Differential/Platelet (Completed)  ?  ? Other  ? Anemia  ?  Denies heavy menses. Check labs and follow up.  ? ?  ?  ? Relevant Orders  ? Vitamin B12 (Completed)  ? Iron, TIBC and Ferritin Panel  ? Anxiety  ?  Continue therapy. Recommend establishing with psychiatrist. List provided. Denies SI ? ?  ?  ? Relevant Orders  ? CBC with Differential/Platelet (Completed)  ? Comprehensive metabolic panel  (Completed)  ? TSH (Completed)  ? T4, free (Completed)  ?  Bipolar 2 disorder, major depressive episode (HCC)  ?  Continue therapy. Recommend establishing with psychiatrist. List provided. Denies SI ? ?  ?  ? Panic attacks  ?  Continue therapy. Recommend establishing with psychiatrist. List provided. Denies SI ? ?  ?  ? Relevant Orders  ? TSH (Completed)  ? T4, free (Completed)  ? ?Other Visit Diagnoses   ? ? Routine screening for STI (sexually transmitted infection)      ? Relevant Orders  ? HIV Antibody (routine testing w rflx)  ? RPR  ? Hepatitis C antibody  ? GC/Chlamydia Probe Amp  ? ?  ? ?Follow up pending all lab results.  ? ?Return for pending labs.  ? ?Hetty Blend, NP-C ? ? ?

## 2021-11-23 NOTE — Assessment & Plan Note (Signed)
Continue therapy. Recommend establishing with psychiatrist. List provided. Denies SI ?

## 2021-11-23 NOTE — Assessment & Plan Note (Signed)
Rapid strep test negative. Recommend symptomatic treatment. Referral to ENT ?

## 2021-11-23 NOTE — Assessment & Plan Note (Signed)
Denies heavy menses. Check labs and follow up.  ?

## 2021-11-23 NOTE — Assessment & Plan Note (Signed)
Due to current tonsillitis. Follow up if not improving.  ?

## 2021-11-23 NOTE — Assessment & Plan Note (Signed)
Continue therapy. Recommend establishing with psychiatrist. List provided. Denies SI ?

## 2021-11-23 NOTE — Patient Instructions (Addendum)
Please go downstairs for labs today.  ? ?Your strep test is negative.  ? ?I am referring you to a ENT (Ears, nose and throat) specialist. They will call you to schedule.  ? ?I recommend salt water gargles and Tylenol for pain.  ? ? ?You can call to schedule your appointment with a psychiatrist.  A few offices are listed below for you to call.  ? ?Dr. Evelene Croon  ?[facility] ?[address] ?[address] ?[place], [place] [address] ?Phone: [phone] ?  ?[facility] ?[address] ?[address] ?[place], [place] [address]  ?Phone: [phone] ? ?[facility] ? [address], [place], [place] [address]  ?Phone: [phone] ?  ?[facility] ?[phone] ?[place], [place] [address] ?[phone] ?  ? ? ?   ?

## 2021-11-24 LAB — RPR: RPR Ser Ql: NONREACTIVE

## 2021-11-24 LAB — HEPATITIS C ANTIBODY
Hepatitis C Ab: NONREACTIVE
SIGNAL TO CUT-OFF: 0.1 (ref <1.00)

## 2021-11-24 LAB — HIV ANTIBODY (ROUTINE TESTING W REFLEX): HIV 1&2 Ab, 4th Generation: NONREACTIVE

## 2021-11-24 LAB — IRON,TIBC AND FERRITIN PANEL
%SAT: 7 % (calc) — ABNORMAL LOW (ref 16–45)
Ferritin: 30 ng/mL (ref 16–154)
Iron: 22 ug/dL — ABNORMAL LOW (ref 40–190)
TIBC: 331 mcg/dL (calc) (ref 250–450)

## 2021-11-26 LAB — GC/CHLAMYDIA PROBE AMP
Chlamydia trachomatis, NAA: NEGATIVE
Neisseria Gonorrhoeae by PCR: POSITIVE — AB

## 2021-11-27 ENCOUNTER — Encounter: Payer: Self-pay | Admitting: Family Medicine

## 2021-11-27 DIAGNOSIS — A549 Gonococcal infection, unspecified: Secondary | ICD-10-CM

## 2021-11-27 HISTORY — DX: Gonococcal infection, unspecified: A54.9

## 2021-11-27 NOTE — Progress Notes (Signed)
Please call her and let her know that she tested positive for gonorrhea which is a sexually transmitted infection. She will need to come in for treatment. She needs to have an injection of ceftriaxone 500 mg x 1 dose. She may come in for a nurse visit to get this done. This should be reported to the health department so please find out protocol for reporting this.  I will send her a mychart note but also explain the need for her to alert her sexual partner and they will need to be treated as well. She should abstain from sexual activity for the next week.

## 2021-11-28 ENCOUNTER — Telehealth: Payer: Self-pay

## 2021-11-28 ENCOUNTER — Ambulatory Visit (INDEPENDENT_AMBULATORY_CARE_PROVIDER_SITE_OTHER): Payer: BC Managed Care – PPO

## 2021-11-28 DIAGNOSIS — A549 Gonococcal infection, unspecified: Secondary | ICD-10-CM | POA: Diagnosis not present

## 2021-11-28 MED ORDER — CEFTRIAXONE SODIUM 250 MG IJ SOLR
250.0000 mg | Freq: Once | INTRAMUSCULAR | Status: AC
  Administered 2021-11-28 (×2): 250 mg via INTRAMUSCULAR

## 2021-11-29 NOTE — Telephone Encounter (Signed)
Orders placed.

## 2021-12-06 ENCOUNTER — Other Ambulatory Visit: Payer: BC Managed Care – PPO

## 2021-12-06 DIAGNOSIS — A549 Gonococcal infection, unspecified: Secondary | ICD-10-CM

## 2021-12-08 LAB — GC/CHLAMYDIA PROBE AMP
Chlamydia trachomatis, NAA: NEGATIVE
Neisseria Gonorrhoeae by PCR: NEGATIVE

## 2021-12-13 NOTE — Progress Notes (Addendum)
This nurse administered 500 mg of Ceftriaxone Sodium per order from PCP into right ventrogluteal area. Patient tolerated injection will.

## 2022-01-06 ENCOUNTER — Encounter: Payer: Self-pay | Admitting: Family Medicine

## 2022-01-06 ENCOUNTER — Encounter (HOSPITAL_COMMUNITY): Payer: Self-pay

## 2022-01-06 ENCOUNTER — Emergency Department (HOSPITAL_COMMUNITY): Payer: BC Managed Care – PPO

## 2022-01-06 ENCOUNTER — Ambulatory Visit (INDEPENDENT_AMBULATORY_CARE_PROVIDER_SITE_OTHER): Payer: BC Managed Care – PPO | Admitting: Family Medicine

## 2022-01-06 ENCOUNTER — Emergency Department (HOSPITAL_COMMUNITY)
Admission: EM | Admit: 2022-01-06 | Discharge: 2022-01-06 | Disposition: A | Discharge status: Home / Self Care | Payer: BC Managed Care – PPO | Attending: Emergency Medicine | Admitting: Emergency Medicine

## 2022-01-06 VITALS — BP 94/62 | HR 123 | Temp 98.7°F | Ht 61.0 in | Wt 135.0 lb

## 2022-01-06 DIAGNOSIS — R791 Abnormal coagulation profile: Secondary | ICD-10-CM | POA: Insufficient documentation

## 2022-01-06 DIAGNOSIS — R509 Fever, unspecified: Secondary | ICD-10-CM | POA: Diagnosis present

## 2022-01-06 DIAGNOSIS — R000 Tachycardia, unspecified: Secondary | ICD-10-CM

## 2022-01-06 DIAGNOSIS — R1110 Vomiting, unspecified: Secondary | ICD-10-CM | POA: Insufficient documentation

## 2022-01-06 DIAGNOSIS — M436 Torticollis: Secondary | ICD-10-CM | POA: Diagnosis not present

## 2022-01-06 DIAGNOSIS — R590 Localized enlarged lymph nodes: Secondary | ICD-10-CM | POA: Diagnosis not present

## 2022-01-06 DIAGNOSIS — R519 Headache, unspecified: Secondary | ICD-10-CM | POA: Diagnosis not present

## 2022-01-06 DIAGNOSIS — R591 Generalized enlarged lymph nodes: Secondary | ICD-10-CM

## 2022-01-06 LAB — URINALYSIS, ROUTINE W REFLEX MICROSCOPIC
Bilirubin Urine: NEGATIVE
Glucose, UA: NEGATIVE mg/dL
Ketones, ur: 80 mg/dL — AB
Leukocytes,Ua: NEGATIVE
Nitrite: NEGATIVE
Protein, ur: 30 mg/dL — AB
Specific Gravity, Urine: 1.027 (ref 1.005–1.030)
pH: 5 (ref 5.0–8.0)

## 2022-01-06 LAB — CBC WITH DIFFERENTIAL/PLATELET
Abs Immature Granulocytes: 0.09 10*3/uL — ABNORMAL HIGH (ref 0.00–0.07)
Basophils Absolute: 0 10*3/uL (ref 0.0–0.1)
Basophils Relative: 0 %
Eosinophils Absolute: 0.1 10*3/uL (ref 0.0–0.5)
Eosinophils Relative: 1 %
HCT: 44.5 % (ref 36.0–46.0)
Hemoglobin: 15 g/dL (ref 12.0–15.0)
Immature Granulocytes: 1 %
Lymphocytes Relative: 6 %
Lymphs Abs: 0.8 10*3/uL (ref 0.7–4.0)
MCH: 30.2 pg (ref 26.0–34.0)
MCHC: 33.7 g/dL (ref 30.0–36.0)
MCV: 89.5 fL (ref 80.0–100.0)
Monocytes Absolute: 0.8 10*3/uL (ref 0.1–1.0)
Monocytes Relative: 6 %
Neutro Abs: 12.2 10*3/uL — ABNORMAL HIGH (ref 1.7–7.7)
Neutrophils Relative %: 86 %
Platelets: 274 10*3/uL (ref 150–400)
RBC: 4.97 MIL/uL (ref 3.87–5.11)
RDW: 12.6 % (ref 11.5–15.5)
WBC: 14 10*3/uL — ABNORMAL HIGH (ref 4.0–10.5)
nRBC: 0 % (ref 0.0–0.2)

## 2022-01-06 LAB — COMPREHENSIVE METABOLIC PANEL
ALT: 22 U/L (ref 0–44)
AST: 22 U/L (ref 15–41)
Albumin: 3.7 g/dL (ref 3.5–5.0)
Alkaline Phosphatase: 50 U/L (ref 38–126)
Anion gap: 12 (ref 5–15)
BUN: 7 mg/dL (ref 6–20)
CO2: 21 mmol/L — ABNORMAL LOW (ref 22–32)
Calcium: 9.4 mg/dL (ref 8.9–10.3)
Chloride: 104 mmol/L (ref 98–111)
Creatinine, Ser: 1.11 mg/dL — ABNORMAL HIGH (ref 0.44–1.00)
GFR, Estimated: >60 mL/min (ref >60)
Glucose, Bld: 102 mg/dL — ABNORMAL HIGH (ref 70–99)
Potassium: 3.5 mmol/L (ref 3.5–5.1)
Sodium: 137 mmol/L (ref 135–145)
Total Bilirubin: 0.9 mg/dL (ref 0.3–1.2)
Total Protein: 7.8 g/dL (ref 6.5–8.1)

## 2022-01-06 LAB — APTT: aPTT: 32 seconds (ref 24–36)

## 2022-01-06 LAB — CSF CELL COUNT WITH DIFFERENTIAL
RBC Count, CSF: 30 /mm3 — ABNORMAL HIGH
Tube #: 4
WBC, CSF: 2 /mm3 (ref 0–5)

## 2022-01-06 LAB — PROTIME-INR
INR: 1.2 (ref 0.8–1.2)
Prothrombin Time: 15 seconds (ref 11.4–15.2)

## 2022-01-06 LAB — LACTIC ACID, PLASMA: Lactic Acid, Venous: 0.7 mmol/L (ref 0.5–1.9)

## 2022-01-06 LAB — PROTEIN AND GLUCOSE, CSF
Glucose, CSF: 62 mg/dL (ref 40–70)
Total  Protein, CSF: 19 mg/dL (ref 15–45)

## 2022-01-06 LAB — I-STAT BETA HCG BLOOD, ED (MC, WL, AP ONLY): I-stat hCG, quantitative: <5 m[IU]/mL (ref <5)

## 2022-01-06 LAB — GROUP A STREP BY PCR: Group A Strep by PCR: NOT DETECTED

## 2022-01-06 MED ORDER — FENTANYL CITRATE PF 50 MCG/ML IJ SOSY
50.0000 ug | PREFILLED_SYRINGE | Freq: Once | INTRAMUSCULAR | Status: AC
  Administered 2022-01-06: 50 ug via INTRAMUSCULAR
  Filled 2022-01-06: qty 1

## 2022-01-06 MED ORDER — LACTATED RINGERS BOLUS PEDS
2000.0000 mL | Freq: Once | INTRAVENOUS | Status: AC
  Administered 2022-01-06: 1967.2 mL via INTRAVENOUS

## 2022-01-06 MED ORDER — KETOROLAC TROMETHAMINE 30 MG/ML IJ SOLN
15.0000 mg | Freq: Once | INTRAMUSCULAR | Status: AC
  Administered 2022-01-06: 15 mg via INTRAMUSCULAR
  Filled 2022-01-06: qty 1

## 2022-01-06 MED ORDER — FENTANYL CITRATE PF 50 MCG/ML IJ SOSY: 50.0000 ug | PREFILLED_SYRINGE | Freq: Once | INTRAMUSCULAR | Status: DC

## 2022-01-06 MED ORDER — ONDANSETRON 8 MG PO TBDP
8.0000 mg | ORAL_TABLET | Freq: Once | ORAL | Status: AC
Start: 2022-01-06 — End: 2022-01-06
  Administered 2022-01-06: 8 mg via ORAL
  Filled 2022-01-06: qty 1

## 2022-01-06 NOTE — ED Provider Notes (Addendum)
Patient signed to Dr. Effie Shy pending results of her LP.  No evidence of infection.  Patient tachycardic here and was given 2 L of IV lactated Ringer's.  Patient remains afebrile.  Patient told me that she did 1 g of Molly over the last several days and last use a few days ago.  Notes that she has a history of anxiety disorder and does feel anxious at this time.  Despite getting IV fluids, patient remains tachycardic.  I have offered her admission for further evaluation of this.  She has declined and would like to go home.  Her tachycardia is sinus in nature.  Suspect that this could be from anxiety or could be from other serious pathology.  Explained to the patient that there could be serious underlying medical conditions and she accepts the risk of leaving.  I have encouraged her to return if she changes her mind   Lorre Nick, MD 01/06/22 2228    Lorre Nick, MD 01/06/22 2229

## 2022-01-06 NOTE — ED Triage Notes (Signed)
Pt arrived via POV, c/o fever, neck pain, and vomiting x2 days.

## 2022-01-06 NOTE — Progress Notes (Signed)
Subjective:     Patient ID: Christy Thomas, female    DOB: Apr 08, 1996, 26 y.o.   MRN: [id number]  Chief Complaint  Patient presents with   Neck Pain    Started noticing slight neck pain 3 days ago but 2 days ago couldn't move her head    Emesis   Fever    102.7 yesterday, 103 today    HPI Patient is in today for a fever of 103, severe neck stiffness and vomiting. States she feels "delirious". Her friend drove her here.   Health Maintenance Due  Topic Date Due   HPV VACCINES (1 - 2-dose series) Never done   PAP-Cervical Cytology Screening  Never done   PAP SMEAR-Modifier  Never done   COVID-19 Vaccine (3 - Pfizer series) 03/06/2020    Past Medical History:  Diagnosis Date   Gonorrhea 11/27/2021    History reviewed. No pertinent surgical history.  Family History  Problem Relation Age of Onset   Lymphoma Mother     Social History   Socioeconomic History   Marital status: Single    Spouse name: Not on file   Number of children: 0   Years of education: Not on file   Highest education level: Not on file  Occupational History   Not on file  Tobacco Use   Smoking status: Never   Smokeless tobacco: Never  Vaping Use   Vaping Use: Never used  Substance and Sexual Activity   Alcohol use: Yes    Alcohol/week: 3.0 standard drinks of alcohol    Types: 3 Shots of liquor per week    Comment: less than once a wk   Drug use: Not Currently    Types: Marijuana   Sexual activity: Not on file  Other Topics Concern   Not on file  Social History Narrative   Not on file   Social Determinants of Health   Financial Resource Strain: Not on file  Food Insecurity: Not on file  Transportation Needs: Not on file  Physical Activity: Not on file  Stress: Not on file  Social Connections: Not on file  Intimate Partner Violence: Not on file    No facility-administered medications prior to visit.   Outpatient Medications Prior to Visit  Medication Sig Dispense Refill    JUNEL FE 24 1-20 MG-MCG(24) tablet Take 1 tablet by mouth daily.     valACYclovir (VALTREX) 1000 MG tablet Take 500 mg by mouth daily.      No Known Allergies  ROS Pertinent positives and negatives in the history of present illness.     Objective:    Physical Exam Constitutional:      Appearance: She is ill-appearing.  Neck:     Comments: Neck is markedly tender to even light palpation  Cardiovascular:     Rate and Rhythm: Tachycardia present.  Pulmonary:     Effort: Pulmonary effort is normal.  Musculoskeletal:     Cervical back: Rigidity and tenderness present. Pain with movement present. Decreased range of motion.  Skin:    General: Skin is warm and dry.  Neurological:     Mental Status: She is alert and oriented to person, place, and time.     BP 94/62 (BP Location: Left Arm, Patient Position: Sitting, Cuff Size: Normal)   Pulse (!) 123   Temp 98.7 F (37.1 C) (Oral)   Ht 5\' 1"  (1.549 m)   Wt 135 lb (61.2 kg)   LMP 01/03/2022   SpO2 98%  BMI 25.51 kg/m  Wt Readings from Last 3 Encounters:  01/06/22 135 lb (61.2 kg)  01/06/22 135 lb (61.2 kg)  11/23/21 135 lb (61.2 kg)       Assessment & Plan:   Problem List Items Addressed This Visit   None Visit Diagnoses     Fever, unspecified fever cause    -  Primary   Acute muscle stiffness of neck       Vomiting, unspecified vomiting type, unspecified whether nausea present       Tachycardia          Patient's presentation is concerning for possible meningitis. Her friend will drive her to the ED for further evaluation.   Christy Thomas does not currently have medications on file.  No orders of the defined types were placed in this encounter.

## 2022-01-06 NOTE — ED Provider Triage Note (Signed)
Emergency Medicine Provider Triage Evaluation Note  Christy Thomas , a 26 y.o. female  was evaluated in triage.  Pt complains of neck stiffness, fever for the past 3 days.  Patient states she had a sudden onset of neck stiffness on Tuesday.  Since that time she has had fevers as high as 103, nausea, vomiting.  Denies headache.  Denies abdominal pain, diarrhea, constipation, chest pain, shortness of breath.  Patient also has concerns about possible STI exposure in her throat.  Patient states that prior to coming to the emergency department she was seen in urgent care who sent her here for further evaluation.  She endorses taking 1000 mg of Tylenol prior to arrival for her fever.  Review of Systems  Positive: Neck pain, neck stiffness, fever, nausea, vomiting Negative: Abdominal pain, headache  Physical Exam  BP 111/64 (BP Location: Right Arm)   Pulse (!) 130   Temp 98.9 F (37.2 C) (Oral)   Resp 18   Ht 5\' 2"  (1.575 m)   Wt 61.2 kg   SpO2 98%   BMI 24.69 kg/m  Gen:   Awake, patient appears ill Resp:  Normal effort  MSK:   Moves extremities without difficulty  Other:  Severe neck tenderness to palpation, palpable cervical nodes  Medical Decision Making  Medically screening exam initiated at 12:32 PM.  Appropriate orders placed.  Christy Thomas was informed that the remainder of the evaluation will be completed by another provider, this initial triage assessment does not replace that evaluation, and the importance of remaining in the ED until their evaluation is complete.     Liliane Channel, PA-C 01/06/22 1237

## 2022-01-06 NOTE — ED Notes (Signed)
Lab reported results of Cerebrospinal Fluids: Showed no organism

## 2022-01-06 NOTE — Discharge Instructions (Signed)
You were offered admission and have deferred at this time.  Please return here if you change your mind.  Avoid all stimulants and drink plenty of liquids.

## 2022-01-06 NOTE — Patient Instructions (Signed)
PLEASE GO TO THE [facility]ERG[facility] DEPARTMENT (ON [address])  TELL THE TRIAGE NURSE THAT I SENT YOU THERE FOR ACUTE FEVER, NECK STIFFNESS, VOMITING

## 2022-01-06 NOTE — ED Provider Notes (Signed)
[facility]-EMERGENCY DEPT Provider Note   CSN: [id number] Arrival date & time: 01/06/22  1205     History  Chief Complaint  Patient presents with   Fever   Neck Pain    Christy Thomas is a 26 y.o. female.  HPI She is here for evaluation of fever, chills, "neck stiffness, and sore lymph nodes."  She reports onset of the symptoms yesterday.  She has had a couple episodes of vomiting.  She has a headache.  She denies dysuria or urinary frequency.  She has not had cough or shortness of breath.  She went to see a another provider today and was directed here for evaluation of possible meningitis.  He is not sure if she has had a meningitis immunization.  No prior similar problems.    Home Medications Prior to Admission medications   Medication Sig Start Date End Date Taking? Authorizing Provider  acetaminophen (TYLENOL) 500 MG tablet Take 1,000 mg by mouth every 6 (six) hours as needed for headache (pain).   Yes [provider]  ibuprofen (ADVIL) 200 MG tablet Take 400 mg by mouth every 6 (six) hours as needed for headache (pain).   Yes [provider]  Multiple Vitamin (MULTIVITAMIN WITH MINERALS) TABS tablet Take 1 tablet by mouth every morning.   Yes [provider]  norethindrone-ethinyl estradiol-FE (LOESTRIN FE) 1-20 MG-MCG tablet Take 1 tablet by mouth every morning.   Yes [provider]  valACYclovir (VALTREX) 500 MG tablet Take 500 mg by mouth every morning. 12/15/21  Yes [provider]      Allergies    Patient has no known allergies.    Review of Systems   Review of Systems  Physical Exam Updated Vital Signs BP 110/76   Pulse (!) 130   Temp 98.9 F (37.2 C) (Oral)   Resp 20   Ht 5\' 2"  (1.575 m)   Wt 61.2 kg   LMP 01/03/2022   SpO2 100%   BMI 24.69 kg/m  Physical Exam Vitals and nursing note reviewed.  Constitutional:      General: She is not in acute distress.    Appearance: She is  well-developed. She is ill-appearing. She is not toxic-appearing or diaphoretic.  HENT:     Head: Normocephalic and atraumatic.     Right Ear: External ear normal.     Left Ear: External ear normal.     Nose: Nose normal. No congestion or rhinorrhea.     Mouth/Throat:     Comments: Tonsils were red and swollen, right greater than left, with a small amount of exudate on the right tonsil.  No intraoral or posterior pharynx swelling.  No respiratory distress.  No stridor. Eyes:     Conjunctiva/sclera: Conjunctivae normal.     Pupils: Pupils are equal, round, and reactive to light.  Neck:     Trachea: Phonation normal.     Comments: She guards against full neck flexion secondary to pain primarily in the right anterior neck.  Small palpable lymph nodes, bilateral jugulodigastric region, right is very tender to touch. Cardiovascular:     Rate and Rhythm: Normal rate and regular rhythm.     Heart sounds: Normal heart sounds.  Pulmonary:     Effort: Pulmonary effort is normal.     Breath sounds: Normal breath sounds.  Abdominal:     General: There is no distension.     Palpations: Abdomen is soft.     Tenderness: There is no abdominal  tenderness.  Musculoskeletal:        General: Normal range of motion.     Cervical back: Normal range of motion and neck supple.  Skin:    General: Skin is warm and dry.  Neurological:     Mental Status: She is alert and oriented to person, place, and time.     Cranial Nerves: No cranial nerve deficit.     Sensory: No sensory deficit.     Motor: No abnormal muscle tone.     Coordination: Coordination normal.  Psychiatric:        Behavior: Behavior normal.        Thought Content: Thought content normal.        Judgment: Judgment normal.     ED Results / Procedures / Treatments   Labs (all labs ordered are listed, but only abnormal results are displayed) Labs Reviewed  COMPREHENSIVE METABOLIC PANEL - Abnormal; Notable for the following components:       Result Value   CO2 21 (*)    Glucose, Bld 102 (*)    Creatinine, Ser 1.11 (*)    All other components within normal limits  CBC WITH DIFFERENTIAL/PLATELET - Abnormal; Notable for the following components:   WBC 14.0 (*)    Neutro Abs 12.2 (*)    Abs Immature Granulocytes 0.09 (*)    All other components within normal limits  URINALYSIS, ROUTINE W REFLEX MICROSCOPIC - Abnormal; Notable for the following components:   APPearance HAZY (*)    Hgb urine dipstick SMALL (*)    Ketones, ur 80 (*)    Protein, ur 30 (*)    Bacteria, UA RARE (*)    All other components within normal limits  GROUP A STREP BY PCR  CULTURE, BLOOD (ROUTINE X 2)  CULTURE, BLOOD (ROUTINE X 2)  URINE CULTURE  CSF CULTURE W GRAM STAIN  LACTIC ACID, PLASMA  PROTIME-INR  APTT  LACTIC ACID, PLASMA  CSF CELL COUNT WITH DIFFERENTIAL  PROTEIN AND GLUCOSE, CSF  HSV 1/2 PCR, CSF  I-STAT BETA HCG BLOOD, ED ([facility], [facility], [facility] ONLY)  GC/CHLAMYDIA PROBE AMP ([place]) NOT AT [facility]    EKG EKG Interpretation  Date/Time:  Friday January 06 2022 12:18:16 EDT Ventricular Rate:  133 PR Interval:  119 QRS Duration: 86 QT Interval:  275 QTC Calculation: 409 R Axis:   84 Text Interpretation: Sinus tachycardia Borderline T wave abnormalities Since last tracing rate faster Otherwise no significant change Confirmed by Mancel Bale [phone]) on 01/06/2022 1:14:11 PM  Radiology CT Head Wo Contrast  Result Date: 01/06/2022 CLINICAL DATA:  Headache, fever, neck pain and vomiting for 2 days EXAM: CT HEAD WITHOUT CONTRAST TECHNIQUE: Contiguous axial images were obtained from the base of the skull through the vertex without intravenous contrast. RADIATION DOSE REDUCTION: This exam was performed according to the departmental dose-optimization program which includes automated exposure control, adjustment of the mA and/or kV according to patient size and/or use of iterative reconstruction technique. COMPARISON:  None FINDINGS: Brain: Normal  ventricular morphology. No midline shift or mass effect. Normal appearance of brain parenchyma. No intracranial hemorrhage, mass lesion, evidence of acute infarction, or extra-axial fluid collection. Vascular: No hyperdense vessels Skull: Intact Sinuses/Orbits: Clear Other: N/A IMPRESSION: Normal exam. Electronically Signed   By: Ulyses Southward M.D.   On: 01/06/2022 14:11   DG Chest Port 1 View  Result Date: 01/06/2022 CLINICAL DATA:  Questionable sepsis - evaluate for abnormality EXAM: PORTABLE CHEST 1 VIEW COMPARISON:  October 01, 2021. FINDINGS: The heart size and mediastinal contours are within normal limits. Both lungs are clear. No visible pleural effusions or pneumothorax. No acute osseous abnormality. IMPRESSION: No active disease. Electronically Signed   By: Feliberto Harts M.D.   On: 01/06/2022 12:50    Procedures .Lumbar Puncture  Date/Time: 01/06/2022 4:26 PM  Performed by: Mancel Bale, MD Authorized by: Mancel Bale, MD   Consent:    Consent obtained:  Verbal   Consent given by:  Patient   Risks discussed:  Bleeding, infection and pain   Alternatives discussed:  No treatment Universal protocol:    Patient identity confirmed:  Verbally with patient Pre-procedure details:    Procedure purpose:  Diagnostic Anesthesia:    Anesthesia method:  Local infiltration   Local anesthetic:  Lidocaine 1% w/o epi Procedure details:    Patient position:  Sitting   Needle gauge:  22   Needle type:  Diamond point   Needle length (in):  3.5   Ultrasound guidance: no     Number of attempts:  1   Fluid appearance:  Blood-tinged then clearing   Tubes of fluid:  4   Total volume (ml):  4 Post-procedure details:    Puncture site:  Adhesive bandage applied   Procedure completion:  Tolerated well, no immediate complications     Medications Ordered in ED Medications  ketorolac (TORADOL) 30 MG/ML injection 15 mg (15 mg Intramuscular Given 01/06/22 1601)  ondansetron (ZOFRAN-ODT)  disintegrating tablet 8 mg (8 mg Oral Given 01/06/22 1601)    ED Course/ Medical Decision Making/ A&P                           Medical Decision Making Patient presenting for several day history of fever, headache and neck stiffness.  She also has sensation of sore lymph nodes of the anterior neck.  She is concerned about having an oropharyngeal STD infection after having oral sex.  She was evaluated for acute infectious processes with laboratory testing, and found to have an elevated white count.  CT of the head negative and will proceed with LP to obtain spinal fluid to evaluate for meningitis.  Problems Addressed: Febrile illness: acute illness or injury Lymphadenopathy: acute illness or injury    Details: Anterior cervical with mild tonsillitis  Amount and/or Complexity of Data Reviewed Independent Historian:     Details: She is a cogent historian Labs: ordered.    Details: CBC, metabolic panel, group A strep test, urinalysis, spinal fluid assessment-initial labs normal except white count elevated, CO2 low, glucose high, creatinine high.  Urinalysis not diagnostic for UTI and she has no urinary tract symptoms. Radiology: ordered.    Details: CT head-no acute abnormalities  Risk Prescription drug management. Decision regarding hospitalization. Risk Details: Patient presenting with symptoms of possible meningitis, primarily has anterior cervical pain with movement and palpable tender nodes of the [place] digastric region bilaterally.  She has tonsillitis appearance with negative strep test.  LP done to evaluate for possible meningitis to include herpes testing.  CT head was negative for acute abnormalities.  Anticipate disposition have returned spinal fluid.  Care to oncoming provider team to evaluate consider appropriate disposition.  Critical Care Total time providing critical care: 35 minutes           Final Clinical Impression(s) / ED Diagnoses Final diagnoses:  Febrile  illness  Lymphadenopathy    Rx / DC Orders ED Discharge Orders     None  Mancel Bale, MD 01/06/22 [phone]

## 2022-01-07 LAB — URINE CULTURE

## 2022-01-09 LAB — GC/CHLAMYDIA PROBE AMP (~~LOC~~) NOT AT ARMC
Chlamydia: NEGATIVE
Comment: NEGATIVE
Comment: NORMAL
Neisseria Gonorrhea: POSITIVE — AB

## 2022-01-10 LAB — CSF CULTURE W GRAM STAIN: Culture: NO GROWTH

## 2022-01-10 LAB — HSV 1/2 PCR, CSF
HSV-1 DNA: NEGATIVE
HSV-2 DNA: NEGATIVE

## 2022-01-11 LAB — CULTURE, BLOOD (ROUTINE X 2)
Culture: NO GROWTH
Special Requests: ADEQUATE

## 2022-01-13 ENCOUNTER — Encounter: Payer: Self-pay | Admitting: Family Medicine

## 2022-01-13 ENCOUNTER — Ambulatory Visit: Payer: BC Managed Care – PPO | Admitting: Family Medicine

## 2022-01-13 VITALS — BP 100/62 | HR 91 | Temp 97.8°F | Ht 62.0 in | Wt 136.0 lb

## 2022-01-13 DIAGNOSIS — R591 Generalized enlarged lymph nodes: Secondary | ICD-10-CM

## 2022-01-13 DIAGNOSIS — A549 Gonococcal infection, unspecified: Secondary | ICD-10-CM | POA: Diagnosis not present

## 2022-01-13 DIAGNOSIS — J0391 Acute recurrent tonsillitis, unspecified: Secondary | ICD-10-CM

## 2022-01-13 DIAGNOSIS — R42 Dizziness and giddiness: Secondary | ICD-10-CM

## 2022-01-13 LAB — CBC WITH DIFFERENTIAL/PLATELET
Basophils Absolute: 0.1 10*3/uL (ref 0.0–0.1)
Basophils Relative: 1 % (ref 0.0–3.0)
Eosinophils Absolute: 0.2 10*3/uL (ref 0.0–0.7)
Eosinophils Relative: 3.1 % (ref 0.0–5.0)
HCT: 39.9 % (ref 36.0–46.0)
Hemoglobin: 13.6 g/dL (ref 12.0–15.0)
Lymphocytes Relative: 41 % (ref 12.0–46.0)
Lymphs Abs: 2.4 10*3/uL (ref 0.7–4.0)
MCHC: 34 g/dL (ref 30.0–36.0)
MCV: 88.8 fl (ref 78.0–100.0)
Monocytes Absolute: 0.6 10*3/uL (ref 0.1–1.0)
Monocytes Relative: 9.4 % (ref 3.0–12.0)
Neutro Abs: 2.7 10*3/uL (ref 1.4–7.7)
Neutrophils Relative %: 45.5 % (ref 43.0–77.0)
Platelets: 529 10*3/uL — ABNORMAL HIGH (ref 150.0–400.0)
RBC: 4.49 Mil/uL (ref 3.87–5.11)
RDW: 12.9 % (ref 11.5–15.5)
WBC: 5.9 10*3/uL (ref 4.0–10.5)

## 2022-01-13 LAB — BASIC METABOLIC PANEL
BUN: 8 mg/dL (ref 6–23)
CO2: 28 mEq/L (ref 19–32)
Calcium: 9.8 mg/dL (ref 8.4–10.5)
Chloride: 102 mEq/L (ref 96–112)
Creatinine, Ser: 0.86 mg/dL (ref 0.40–1.20)
GFR: 94.8 mL/min (ref >60.00)
Glucose, Bld: 92 mg/dL (ref 70–99)
Potassium: 3.8 mEq/L (ref 3.5–5.1)
Sodium: 137 mEq/L (ref 135–145)

## 2022-01-13 MED ORDER — CEFTRIAXONE SODIUM 500 MG IJ SOLR
500.0000 mg | Freq: Once | INTRAMUSCULAR | Status: AC
  Administered 2022-01-13: 500 mg via INTRAMUSCULAR

## 2022-01-13 NOTE — Assessment & Plan Note (Signed)
Negative strep test in the ED.  Unclear as to whether her throat swab was positive for gonorrhea or whether the positive gonorrhea test was in her urine.  This was done in the ED.  Treated today with ceftriaxone 500 mg IM.  I will check into why she has not heard from ENT referral.

## 2022-01-13 NOTE — Progress Notes (Signed)
Subjective:     Patient ID: Christy Thomas, female    DOB: 10-31-1995, 26 y.o.   MRN: [id number]  Chief Complaint  Patient presents with   Exposure to STD    Tested positive for gonorrhea     HPI Patient is in today for positive gonorrhea infection. This was done in the ED and she was not treated.  Unclear as to whether this was gonorrhea of the throat or vagina.  She does not recall having a vaginal swab while in the ED but does recall a throat swab.  She was negative for strep.  Negative for chlamydia.  Denies vaginal discharge or irritation.  Complains of recurrent tonsillitis and has not heard from ENT. Referral made in May.   Having intermittent dizziness but has improved significantly.  Serum creatinine elevated while in the emergency department for febrile illness.  No longer having fever, chills, headache.  No chest pain, palpitations, shortness of breath, abdominal pain, nausea, vomiting or diarrhea.  Health Maintenance Due  Topic Date Due   HPV VACCINES (1 - 2-dose series) Never done   PAP-Cervical Cytology Screening  Never done   PAP SMEAR-Modifier  Never done    Past Medical History:  Diagnosis Date   Gonorrhea 11/27/2021    History reviewed. No pertinent surgical history.  Family History  Problem Relation Age of Onset   Lymphoma Mother     Social History   Socioeconomic History   Marital status: Single    Spouse name: Not on file   Number of children: 0   Years of education: Not on file   Highest education level: Not on file  Occupational History   Not on file  Tobacco Use   Smoking status: Never   Smokeless tobacco: Never  Vaping Use   Vaping Use: Never used  Substance and Sexual Activity   Alcohol use: Yes    Alcohol/week: 3.0 standard drinks of alcohol    Types: 3 Shots of liquor per week    Comment: less than once a wk   Drug use: Not Currently    Types: Marijuana   Sexual activity: Not on file  Other Topics Concern   Not on file   Social History Narrative   Not on file   Social Determinants of Health   Financial Resource Strain: Not on file  Food Insecurity: Not on file  Transportation Needs: Not on file  Physical Activity: Not on file  Stress: Not on file  Social Connections: Not on file  Intimate Partner Violence: Not on file    Outpatient Medications Prior to Visit  Medication Sig Dispense Refill   acetaminophen (TYLENOL) 500 MG tablet Take 1,000 mg by mouth every 6 (six) hours as needed for headache (pain).     ibuprofen (ADVIL) 200 MG tablet Take 400 mg by mouth every 6 (six) hours as needed for headache (pain).     Multiple Vitamin (MULTIVITAMIN WITH MINERALS) TABS tablet Take 1 tablet by mouth every morning.     norethindrone-ethinyl estradiol-FE (LOESTRIN FE) 1-20 MG-MCG tablet Take 1 tablet by mouth every morning.     valACYclovir (VALTREX) 500 MG tablet Take 500 mg by mouth every morning.     No facility-administered medications prior to visit.    No Known Allergies  ROS     Objective:    Physical Exam Constitutional:      General: She is not in acute distress.    Appearance: She is not ill-appearing.  HENT:  Right Ear: Tympanic membrane, ear canal and external ear normal.     Left Ear: Tympanic membrane, ear canal and external ear normal.     Nose: Nose normal.     Mouth/Throat:     Mouth: Mucous membranes are moist.     Pharynx: Uvula midline. Posterior oropharyngeal erythema present. No oropharyngeal exudate or uvula swelling.     Tonsils: No tonsillar exudate or tonsillar abscesses. 2+ on the right. 2+ on the left.  Eyes:     Conjunctiva/sclera: Conjunctivae normal.     Pupils: Pupils are equal, round, and reactive to light.  Neck:     Comments: Bilateral anterior cervical adenopathy Cardiovascular:     Rate and Rhythm: Normal rate and regular rhythm.  Pulmonary:     Effort: Pulmonary effort is normal.     Breath sounds: Normal breath sounds.  Musculoskeletal:      Cervical back: Normal range of motion and neck supple.  Lymphadenopathy:     Cervical: Cervical adenopathy present.  Skin:    General: Skin is warm and dry.  Neurological:     General: No focal deficit present.     Mental Status: She is alert and oriented to person, place, and time.     Motor: Motor function is intact.  Psychiatric:        Attention and Perception: Attention normal.        Mood and Affect: Mood normal.        Speech: Speech normal.        Behavior: Behavior normal.        Cognition and Memory: Cognition normal.     BP 100/62 (BP Location: Left Arm, Patient Position: Sitting, Cuff Size: Large)   Pulse 91   Temp 97.8 F (36.6 C) (Temporal)   Ht 5\' 2"  (1.575 m)   Wt 136 lb (61.7 kg)   LMP 01/03/2022   SpO2 99%   BMI 24.87 kg/m  Wt Readings from Last 3 Encounters:  01/13/22 136 lb (61.7 kg)  01/06/22 135 lb (61.2 kg)  01/06/22 135 lb (61.2 kg)       Assessment & Plan:   Problem List Items Addressed This Visit       Respiratory   Recurrent tonsillitis    Negative strep test in the ED.  Unclear as to whether her throat swab was positive for gonorrhea or whether the positive gonorrhea test was in her urine.  This was done in the ED.  Treated today with ceftriaxone 500 mg IM.  I will check into why she has not heard from ENT referral.      Relevant Orders   CBC with Differential/Platelet   Basic metabolic panel     Immune and Lymphatic   Lymphadenopathy    Ceftriaxone 500 mg IM given.  Recurrent tonsillitis.  Negative strep test.  Recommend NSAIDs and salt water gargles. she has been referred to ENT.  She will follow-up if lymphadenopathy does not resolve.        Other   Dizziness    Encouraged good hydration.  Dizziness has improved significantly.  Check labs and follow-up.      Relevant Orders   CBC with Differential/Platelet   Basic metabolic panel   Infection due to Neisseria gonorrhoeae - Primary    Ceftriaxone 500 mg IM given.  She was  negative for chlamydia.  Recommend she return in 7 to 14 days for test of cure since it is unclear as to whether the gonorrhea infection  was vaginal versus throat.  Recommend testing both areas to ensure resolution       I am having Christy Thomas maintain her acetaminophen, ibuprofen, norethindrone-ethinyl estradiol-FE, valACYclovir, and multivitamin with minerals. We administered cefTRIAXone.  Meds ordered this encounter  Medications   cefTRIAXone (ROCEPHIN) injection 500 mg

## 2022-01-13 NOTE — Assessment & Plan Note (Signed)
Encouraged good hydration.  Dizziness has improved significantly.  Check labs and follow-up.

## 2022-01-13 NOTE — Patient Instructions (Signed)
You received ceftriaxone 500 mg IM today.  I recommend salt water gargles (1/2 teaspoon of salt in 6 ounces of warm water)  You may take ibuprofen 600 mg or 1-2 Aleve tablets for sore throat, neck pain or headache.  I will check your labs to make sure they are improving.  Follow-up in 7 to 14 days for a recheck.  Avoid all sexual activity until you return.

## 2022-01-13 NOTE — Assessment & Plan Note (Addendum)
Ceftriaxone 500 mg IM given.  Recurrent tonsillitis.  Negative strep test.  Recommend NSAIDs and salt water gargles. she has been referred to ENT.  She will follow-up if lymphadenopathy does not resolve.

## 2022-01-13 NOTE — Assessment & Plan Note (Signed)
Ceftriaxone 500 mg IM given.  She was negative for chlamydia.  Recommend she return in 7 to 14 days for test of cure since it is unclear as to whether the gonorrhea infection was vaginal versus throat.  Recommend testing both areas to ensure resolution

## 2022-01-25 ENCOUNTER — Encounter: Payer: Self-pay | Admitting: Family Medicine

## 2022-01-25 ENCOUNTER — Ambulatory Visit (INDEPENDENT_AMBULATORY_CARE_PROVIDER_SITE_OTHER): Payer: BC Managed Care – PPO | Admitting: Family Medicine

## 2022-01-25 VITALS — BP 96/58 | HR 81 | Temp 97.6°F | Ht 62.0 in | Wt 134.0 lb

## 2022-01-25 DIAGNOSIS — R300 Dysuria: Secondary | ICD-10-CM

## 2022-01-25 DIAGNOSIS — A549 Gonococcal infection, unspecified: Secondary | ICD-10-CM

## 2022-01-25 DIAGNOSIS — N3001 Acute cystitis with hematuria: Secondary | ICD-10-CM | POA: Diagnosis not present

## 2022-01-25 DIAGNOSIS — R319 Hematuria, unspecified: Secondary | ICD-10-CM | POA: Diagnosis not present

## 2022-01-25 LAB — POCT URINALYSIS DIPSTICK
Bilirubin, UA: NEGATIVE
Blood, UA: POSITIVE
Glucose, UA: NEGATIVE
Ketones, UA: NEGATIVE
Nitrite, UA: NEGATIVE
Protein, UA: POSITIVE — AB
Spec Grav, UA: >=1.03 — AB (ref 1.010–1.025)
Urobilinogen, UA: 0.2 E.U./dL
pH, UA: 6 (ref 5.0–8.0)

## 2022-01-25 MED ORDER — NITROFURANTOIN MONOHYD MACRO 100 MG PO CAPS: 100.0000 mg | ORAL_CAPSULE | Freq: Two times a day (BID) | ORAL | 0 refills | Status: DC

## 2022-01-25 NOTE — Progress Notes (Signed)
Subjective:     Patient ID: Christy Thomas, female    DOB: August 20, 1995, 26 y.o.   MRN: [id number]  Chief Complaint  Patient presents with   Follow-up   Dysuria   Urinary Frequency    Dysuria  Associated symptoms include frequency.  Urinary Frequency  Associated symptoms include frequency.   Patient is in today for follow up on gonorrhea infection.  Diagnosed during emergency department visit and unsure if the infection was throat versus vaginal.  She was treated and here today for a recheck to ensure resolution.  Denies fever, chills, sore throat, abdominal pain, back pain, nausea, vomiting or diarrhea.  Denies sexual activity since her last visit.  Complains of a 24-hour history of urinary frequency, urgency and dysuria.  LMP: 2 weeks ago.     Health Maintenance Due  Topic Date Due   HPV VACCINES (1 - 2-dose series) Never done   PAP-Cervical Cytology Screening  Never done   PAP SMEAR-Modifier  Never done    Past Medical History:  Diagnosis Date   Gonorrhea 11/27/2021    History reviewed. No pertinent surgical history.  Family History  Problem Relation Age of Onset   Lymphoma Mother     Social History   Socioeconomic History   Marital status: Single    Spouse name: Not on file   Number of children: 0   Years of education: Not on file   Highest education level: Not on file  Occupational History   Not on file  Tobacco Use   Smoking status: Never   Smokeless tobacco: Never  Vaping Use   Vaping Use: Never used  Substance and Sexual Activity   Alcohol use: Yes    Alcohol/week: 3.0 standard drinks of alcohol    Types: 3 Shots of liquor per week    Comment: less than once a wk   Drug use: Not Currently    Types: Marijuana   Sexual activity: Not on file  Other Topics Concern   Not on file  Social History Narrative   Not on file   Social Determinants of Health   Financial Resource Strain: Not on file  Food Insecurity: Not on file  Transportation  Needs: Not on file  Physical Activity: Not on file  Stress: Not on file  Social Connections: Not on file  Intimate Partner Violence: Not on file    Outpatient Medications Prior to Visit  Medication Sig Dispense Refill   acetaminophen (TYLENOL) 500 MG tablet Take 1,000 mg by mouth every 6 (six) hours as needed for headache (pain).     ibuprofen (ADVIL) 200 MG tablet Take 400 mg by mouth every 6 (six) hours as needed for headache (pain).     Multiple Vitamin (MULTIVITAMIN WITH MINERALS) TABS tablet Take 1 tablet by mouth every morning.     norethindrone-ethinyl estradiol-FE (LOESTRIN FE) 1-20 MG-MCG tablet Take 1 tablet by mouth every morning.     valACYclovir (VALTREX) 500 MG tablet Take 500 mg by mouth every morning.     No facility-administered medications prior to visit.    No Known Allergies  Review of Systems  Genitourinary:  Positive for dysuria and frequency.       Objective:    Physical Exam Constitutional:      General: She is not in acute distress.    Appearance: She is not ill-appearing.  HENT:     Mouth/Throat:     Mouth: Mucous membranes are moist.     Pharynx: Oropharynx is clear.  Eyes:     Extraocular Movements: Extraocular movements intact.     Conjunctiva/sclera: Conjunctivae normal.     Pupils: Pupils are equal, round, and reactive to light.  Cardiovascular:     Rate and Rhythm: Normal rate.  Pulmonary:     Effort: Pulmonary effort is normal.  Abdominal:     General: There is no distension.     Palpations: Abdomen is soft.     Tenderness: There is no abdominal tenderness. There is no right CVA tenderness, left CVA tenderness, guarding or rebound.  Genitourinary:    Comments: Self swab performed  Musculoskeletal:     Cervical back: Normal range of motion.  Skin:    General: Skin is warm and dry.  Neurological:     General: No focal deficit present.     Mental Status: She is alert and oriented to person, place, and time.  Psychiatric:         Mood and Affect: Mood normal.        Behavior: Behavior normal.        Thought Content: Thought content normal.     BP (!) 96/58 (BP Location: Left Arm, Patient Position: Sitting, Cuff Size: Large)   Pulse 81   Temp 97.6 F (36.4 C) (Temporal)   Ht 5\' 2"  (1.575 m)   Wt 134 lb (60.8 kg)   LMP 01/03/2022   SpO2 99%   BMI 24.51 kg/m  Wt Readings from Last 3 Encounters:  01/25/22 134 lb (60.8 kg)  01/13/22 136 lb (61.7 kg)  01/06/22 135 lb (61.2 kg)       Assessment & Plan:   Problem List Items Addressed This Visit       Genitourinary   Acute cystitis with hematuria - Primary    Non toxic. Benign exam. Macrobid prescribed. Urine culture sent.  Discussed red flag symptoms due to hx of pyelonephritis.       Relevant Medications   nitrofurantoin, macrocrystal-monohydrate, (MACROBID) 100 MG capsule     Other   Infection due to Neisseria gonorrhoeae    Oral and vaginal swabs done (self vaginal swab) to ensure resolution of gonorrhea. Asymptomatic.  Discussed PrEP and she may return to start on this.       Relevant Medications   nitrofurantoin, macrocrystal-monohydrate, (MACROBID) 100 MG capsule   Other Relevant Orders   GC/Chlamydia Probe Amp   GC/Chlamydia Probe Amp   Other Visit Diagnoses     Dysuria       Relevant Orders   POCT urinalysis dipstick (Completed)   Urine Culture   Hematuria, unspecified type       Relevant Orders   Urine Culture       I am having Christy Thomas start on nitrofurantoin (macrocrystal-monohydrate). I am also having her maintain her acetaminophen, ibuprofen, norethindrone-ethinyl estradiol-FE, valACYclovir, and multivitamin with minerals.  Meds ordered this encounter  Medications   nitrofurantoin, macrocrystal-monohydrate, (MACROBID) 100 MG capsule    Sig: Take 1 capsule (100 mg total) by mouth 2 (two) times daily.    Dispense:  14 capsule    Refill:  0    Order Specific Question:   Supervising Provider    Answer:   01/08/22 A [[id number]]

## 2022-01-25 NOTE — Patient Instructions (Signed)
Take the antibiotic as prescribed.  Make sure you are drinking plenty of water.  If you develop fever, chills, abdominal pain, back pain, nausea, vomiting then I recommend you get rechecked.

## 2022-01-25 NOTE — Assessment & Plan Note (Signed)
Non toxic. Benign exam. Macrobid prescribed. Urine culture sent.  Discussed red flag symptoms due to hx of pyelonephritis.

## 2022-01-25 NOTE — Assessment & Plan Note (Signed)
Oral and vaginal swabs done (self vaginal swab) to ensure resolution of gonorrhea. Asymptomatic.  Discussed PrEP and she may return to start on this.

## 2022-01-27 ENCOUNTER — Other Ambulatory Visit: Payer: Self-pay

## 2022-01-27 ENCOUNTER — Ambulatory Visit (INDEPENDENT_AMBULATORY_CARE_PROVIDER_SITE_OTHER): Payer: BC Managed Care – PPO | Admitting: Pharmacist

## 2022-01-27 ENCOUNTER — Other Ambulatory Visit (HOSPITAL_COMMUNITY): Payer: Self-pay

## 2022-01-27 DIAGNOSIS — Z3202 Encounter for pregnancy test, result negative: Secondary | ICD-10-CM

## 2022-01-27 DIAGNOSIS — Z79899 Other long term (current) drug therapy: Secondary | ICD-10-CM

## 2022-01-27 DIAGNOSIS — Z23 Encounter for immunization: Secondary | ICD-10-CM | POA: Diagnosis not present

## 2022-01-27 LAB — URINE CULTURE

## 2022-01-27 LAB — POCT URINE PREGNANCY: Preg Test, Ur: NEGATIVE

## 2022-01-27 NOTE — Patient Instructions (Signed)
Pharmacy Team Phone #: [phone]

## 2022-01-27 NOTE — Progress Notes (Signed)
Date:  01/27/2022   HPI: Christy Thomas is a 26 y.o. female who presents to the [facility] pharmacy clinic to discuss and initiate PrEP.  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 11/27/2021   Recurrent tonsillitis 11/23/2021   Acute pharyngitis 11/23/2021   Anxiety 11/23/2021   Bipolar 2 disorder, major depressive episode (HCC) 11/23/2021   Panic attacks 11/23/2021   Lymphadenopathy 11/23/2021   Anemia 11/23/2021   ESBL (extended spectrum beta-lactamase) producing bacteria infection 10/18/2021   PICC (peripherally inserted central catheter) in place 10/18/2021   Normocytic anemia 09/30/2021   Acute pyelonephritis 09/28/2021   Hypokalemia 09/28/2021   Urinary tract infection due to extended-spectrum beta lactamase (ESBL) producing Escherichia coli 09/28/2021    Patient's Medications  New Prescriptions   No medications on file  Previous Medications   ACETAMINOPHEN (TYLENOL) 500 MG TABLET    Take 1,000 mg by mouth every 6 (six) hours as needed for headache (pain).   IBUPROFEN (ADVIL) 200 MG TABLET    Take 400 mg by mouth every 6 (six) hours as needed for headache (pain).   MULTIPLE VITAMIN (MULTIVITAMIN WITH MINERALS) TABS TABLET    Take 1 tablet by mouth every morning.   NITROFURANTOIN, MACROCRYSTAL-MONOHYDRATE, (MACROBID) 100 MG CAPSULE    Take 1 capsule (100 mg total) by mouth 2 (two) times daily.   NORETHINDRONE-ETHINYL ESTRADIOL-FE (LOESTRIN FE) 1-20 MG-MCG TABLET    Take 1 tablet by mouth every morning.   VALACYCLOVIR (VALTREX) 500 MG TABLET    Take 500 mg by mouth every morning.  Modified Medications   No medications on file  Discontinued Medications   No medications on file    Allergies: No Known Allergies  Past Medical History: Past Medical History:  Diagnosis Date   Gonorrhea 11/27/2021    Social History: Social  History   Socioeconomic History   Marital status: Single    Spouse name: Not on file   Number of children: 0   Years of education: Not on file   Highest education level: Not on file  Occupational History   Not on file  Tobacco Use   Smoking status: Never   Smokeless tobacco: Never  Vaping Use   Vaping Use: Never used  Substance and Sexual Activity   Alcohol use: Yes    Alcohol/week: 3.0 standard drinks of alcohol    Types: 3 Shots of liquor per week    Comment: less than once a wk   Drug use: Not Currently    Types: Marijuana   Sexual activity: Not on file  Other Topics Concern   Not on file  Social History Narrative   Not on file   Social Determinants of Health   Financial Resource Strain: Not on file  Food Insecurity: Not on file  Transportation Needs: Not on file  Physical Activity: Not on file  Stress: Not on file  Social Connections: Not on file       01/27/2022    9:56 AM  [facility] HIV PREP FLOWSHEET RESULTS  Insurance Status Insured  How did you hear? friend recommended  Gender at birth Female  Gender identity cis-Female  Sex Partners Both men and women  # sex partners past 3-6 mos 6-8  Sex activity preferences Receptive;Oral  Condom use Yes  % condom use 80  Treated for STI? Yes  HIV symptoms? Sore throat;High Fever;Flu-like/Mono-like symptoms;Swollen lymph nodes in your neck  PrEP Eligibility Yes  Preg status No  Breastfeeding? No  Paper work received? Yes    Labs:  SCr: Lab Results  Component Value Date   CREATININE 0.86 01/13/2022   CREATININE 1.11 (H) 01/06/2022   CREATININE 0.81 11/23/2021   CREATININE 0.82 09/29/2021   CREATININE 0.94 09/28/2021   HIV Lab Results  Component Value Date   HIV NON-REACTIVE 11/23/2021   HIV Non Reactive 09/29/2021   Hepatitis B No results found for: "HEPBSAB", "HEPBSAG", "HEPBCAB" Hepatitis C Lab Results  Component Value Date   HEPCAB NON-REACTIVE 11/23/2021   Hepatitis A No results found for:  "HAV" RPR and STI Lab Results  Component Value Date   LABRPR NON-REACTIVE 11/23/2021    STI Results GC CT  01/06/2022 12:37 PM Positive  Negative   12/06/2021  1:55 PM  Negative   11/23/2021 10:51 AM  Negative   09/25/2021 11:19 AM Negative  Negative   08/26/2021  1:37 PM Negative  Negative   10/29/2020  6:26 PM Negative  Negative     Assessment: Christy Thomas presents to clinic today to discuss and initiate PrEP. She states she heard about our program from a friend, and she is a great candidate for PrEP. Has been sexually active with ~6 female and female partners in the last several months and participates in vaginal and oral sex. She states she uses condoms ~80% of the time. 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 She recently went to the ED for swollen lymph nodes, sore throat, and flu-like illness. Work-up there was negative besides testing positive for pharnygeal gonorrhea for which she received ceftriaxone. She states all symptoms have now resolved. Reviewed that these could potentially be signs of acute HIV. Christy Thomas states she was sexually active with a new partner ~1 week prior to experiencing these symptoms and she used condoms. Will check HIV antibody today along with HIV RNA.   She recently saw her PCP this week who checked BMP, urine/oral swabs for gonorrhea/chlamydia testing, and RPR. Will defer these today and will also check urine pregnancy test and hepatitis serologies. After reviewing her two options for PrEP, she would prefer starting Truvada. Will send to [facility] on [address] once her HIV antibody is negative. She is insured through her mother's BCBS plan which [facility] should have on file. Her copay should be $0. Will follow up with her in 3 months.   Counseled patient that Truvada is a one pill once daily regimen with or without food that can prevent HIV. Discussed the importance of taking the  medication daily to provide protection and decreased adherence is associated with decreased efficacy. Also discussed how Truvada works to prevent HIV but not other STDs and encouraged the use of condoms. Counseled on what to do if dose is missed, if closer to missed dose take immediately, if closer to next dose then skip and resume normal schedule.Counseled patient that Truvada is normally well tolerated, however some patients experience a "start up syndrome" with nausea, diarrhea, dizziness, and fatigue but that those should resolve soon after starting. Reviewed potential adverse events such as nephrotoxicity and bone density loss with Truvada. Advised that any nausea can be mitigated by taking it with food. I reviewed patient medications and found no drug interactions. Discussed how our PrEP process works here at the clinic including follow ups and lab monitoring every  3 months.  Patient is also eligible for the HPV vaccination. She states she already has been exposed to HPV. 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 Do not see further work up documented regarding which HPV strains she may have tested positive for. Reviewed that, per the CDC, she is still eligible for HPV vaccination against strains she may not currently be exposed to. She verbalized understanding and requested the vaccine. Administered first dose today; will receive second and third injections at subsequent visits.  Plan: Check HIV antibody, urine pregnancy test, and hepatitis A/B/C serologies Start Truvada if HIV antibody negative Administer HPV vaccine #1 Follow-up with me on 10/20 at 11:30am  Margarite Gouge, PharmD, CPP, BCIDP Clinical Pharmacist Practitioner Infectious Diseases Clinical Pharmacist [facility] 01/27/2022, 10:51 AM

## 2022-01-30 ENCOUNTER — Other Ambulatory Visit: Payer: Self-pay | Admitting: Internal Medicine

## 2022-01-30 LAB — GC/CHLAMYDIA PROBE AMP
Chlamydia trachomatis, NAA: NEGATIVE
Chlamydia trachomatis, NAA: NEGATIVE
Neisseria Gonorrhoeae by PCR: NEGATIVE
Neisseria Gonorrhoeae by PCR: NEGATIVE

## 2022-01-30 MED ORDER — SULFAMETHOXAZOLE-TRIMETHOPRIM 800-160 MG PO TABS: 1.0000 | ORAL_TABLET | Freq: Two times a day (BID) | ORAL | 0 refills | Status: DC

## 2022-01-31 ENCOUNTER — Telehealth: Payer: Self-pay | Admitting: Pharmacist

## 2022-01-31 DIAGNOSIS — Z79899 Other long term (current) drug therapy: Secondary | ICD-10-CM

## 2022-01-31 LAB — HIV-1 RNA QUANT-NO REFLEX-BLD
HIV 1 RNA Quant: NOT DETECTED Copies/mL
HIV-1 RNA Quant, Log: NOT DETECTED Log cps/mL

## 2022-01-31 LAB — HEPATITIS C ANTIBODY: Hepatitis C Ab: NONREACTIVE

## 2022-01-31 LAB — HEPATITIS B SURFACE ANTIGEN: Hepatitis B Surface Ag: NONREACTIVE

## 2022-01-31 LAB — HIV ANTIBODY (ROUTINE TESTING W REFLEX): HIV 1&2 Ab, 4th Generation: NONREACTIVE

## 2022-01-31 LAB — HEPATITIS A ANTIBODY, TOTAL: Hepatitis A AB,Total: NONREACTIVE

## 2022-01-31 LAB — HEPATITIS B SURFACE ANTIBODY,QUALITATIVE: Hep B S Ab: NONREACTIVE

## 2022-01-31 MED ORDER — EMTRICITABINE-TENOFOVIR DF 200-300 MG PO TABS: 1.0000 | ORAL_TABLET | Freq: Every day | ORAL | 2 refills | Status: DC

## 2022-01-31 NOTE — Telephone Encounter (Signed)
Reviewed lab results with patient and sent Truvada to [facility] on [address]. They should have her pharmacy benefits card on file, and her copay should be $0. Her previous symptoms were likely due to her pharyngeal gonorrhea. Patient verbalized understanding and had no further questions.  Margarite Gouge, PharmD, CPP, BCIDP Clinical Pharmacist Practitioner Infectious Diseases Clinical Pharmacist [facility]

## 2022-02-02 ENCOUNTER — Telehealth: Payer: Self-pay | Admitting: Family Medicine

## 2022-02-02 NOTE — Telephone Encounter (Signed)
PT calls today in regards to recent lab results for 07/19. I informed PT that we have not had a provider look over these results and that Hetty Blend would be out for another week. I did let PT know that we would get this checked by another provider.  CB: [phone]

## 2022-02-02 NOTE — Telephone Encounter (Signed)
This nurse contacted patient to answer her questions regarding her results from 7/19.

## 2022-02-24 ENCOUNTER — Encounter: Payer: Self-pay | Admitting: Family Medicine

## 2022-02-24 ENCOUNTER — Ambulatory Visit (INDEPENDENT_AMBULATORY_CARE_PROVIDER_SITE_OTHER): Payer: BC Managed Care – PPO | Admitting: Family Medicine

## 2022-02-24 ENCOUNTER — Other Ambulatory Visit (HOSPITAL_COMMUNITY)
Admission: RE | Admit: 2022-02-24 | Discharge: 2022-02-24 | Disposition: A | Discharge status: Home / Self Care | Payer: BC Managed Care – PPO | Source: Ambulatory Visit | Attending: Family Medicine | Admitting: Family Medicine

## 2022-02-24 VITALS — BP 110/68 | HR 88 | Temp 97.6°F | Ht 62.0 in | Wt 136.0 lb

## 2022-02-24 DIAGNOSIS — N898 Other specified noninflammatory disorders of vagina: Secondary | ICD-10-CM | POA: Insufficient documentation

## 2022-02-24 DIAGNOSIS — B3731 Acute candidiasis of vulva and vagina: Secondary | ICD-10-CM | POA: Diagnosis not present

## 2022-02-24 DIAGNOSIS — Z9189 Other specified personal risk factors, not elsewhere classified: Secondary | ICD-10-CM

## 2022-02-24 DIAGNOSIS — R350 Frequency of micturition: Secondary | ICD-10-CM | POA: Diagnosis not present

## 2022-02-24 DIAGNOSIS — Z87440 Personal history of urinary (tract) infections: Secondary | ICD-10-CM | POA: Diagnosis not present

## 2022-02-24 LAB — POCT URINALYSIS DIPSTICK
Bilirubin, UA: NEGATIVE
Blood, UA: NEGATIVE
Glucose, UA: NEGATIVE
Ketones, UA: NEGATIVE
Leukocytes, UA: NEGATIVE
Nitrite, UA: NEGATIVE
Protein, UA: NEGATIVE
Spec Grav, UA: >=1.03 — AB (ref 1.010–1.025)
Urobilinogen, UA: 0.2 E.U./dL
pH, UA: 6 (ref 5.0–8.0)

## 2022-02-24 LAB — POCT URINE PREGNANCY: Preg Test, Ur: NEGATIVE

## 2022-02-24 MED ORDER — FLUCONAZOLE 150 MG PO TABS: 150.0000 mg | ORAL_TABLET | Freq: Once | ORAL | 0 refills | Status: AC

## 2022-02-24 NOTE — Progress Notes (Unsigned)
Subjective:  Christy Thomas is a 26 y.o. female who complains of possible urinary tract infection.  She has had symptoms for 3 days.  Symptoms include  urinary frequency, urgency and dysuria . Patient denies  fever, chills, headache, sore throat, chest pain, palpitations, shortness of breath, back pain, N/V/D .  Last UTI was last month.   Using nothing for current symptoms.    Recent sexual activity and requests STD testing    Loestrin daily. No missed doses.   Plans to start on PrEP. She is seeing [facility]   No other aggravating or relieving factors.  No other c/o.  Past Medical History:  Diagnosis Date   Gonorrhea 11/27/2021    ROS as in subjective  Reviewed allergies, medications, past medical, surgical, and social history.    Objective: Vitals:   02/24/22 0929  BP: 110/68  Pulse: 88  Temp: 97.6 F (36.4 C)  SpO2: 96%    General appearance: alert, no distress, WD/WN, female Pharyngeal area is normal. Neck is supple without adenopathy or thyromegaly.  Cardiac exam shows a regular sinus rhythm without murmurs or gallops.  Lungs are clear to auscultation. Abdomen: +bs, soft, tender to palpation over suprapubic area, non tender otherwise , non distended Back: no CVA tenderness GU: deferred. Self swab done     Laboratory:  Urine dipstick: negative for all components.       Assessment: Vaginal itching - Plan: POCT urinalysis dipstick, Cervicovaginal ancillary only( [place]), Urine Culture, fluconazole (DIFLUCAN) 150 MG tablet, Urine Culture  History of recurrent UTI (urinary tract infection) - Plan: Urine Culture, POCT urine pregnancy, Urine Culture  Urinary frequency - Plan: Urine Culture, Urine Culture  At risk for sexually transmitted disease due to unprotected sex - Plan: Cervicovaginal ancillary only( [place]), HIV antibody (with reflex), Hepatitis C antibody, Hepatitis B surface antibody,qualitative, RPR, POCT urine pregnancy, HIV antibody (with reflex),  RPR, Hepatitis B surface antibody,qualitative, Hepatitis C antibody   Plan:  UA negative. Urine culture sent. Urine preg- negative  STD testing ordered including BV, Hep C and Hep B Diflucan prescribed empirically Advised increased water intake, can use OTC Tylenol for pain.    Advised to follow up with ID for PrEP since she has seen them and they initiated a prescription to her pharmacy for Truvada.    Call or return her or at OB/GYN if worse or not improving.

## 2022-02-24 NOTE — Patient Instructions (Signed)
Your urine today is negative for a UTI.  I will send your urine for culture.   Take the diflucan oral tablet for yeast.   We will be in touch with your results.    Safe Sex Practicing safe sex means taking steps before and during sex to reduce your risk of: Getting an STI (sexually transmitted infection). Giving your partner an STI. Unwanted or unplanned pregnancy. How to practice safe sex Ways you can practice safe sex  Limit your sexual partners to only one partner who is having sex with only you. Avoid using alcohol and drugs before having sex. Alcohol and drugs can affect your judgment. Before having sex with a new partner: Talk to your partner about past partners, past STIs, and drug use. Get screened for STIs and discuss the results with your partner. Ask your partner to get screened too. Check your body regularly for sores, blisters, rashes, or unusual discharge. If you notice any of these problems, visit your health care provider. Avoid sexual contact if you have symptoms of an infection or you are being treated for an STI. While having sex, use a condom. Make sure to: Use a condom every time you have vaginal, oral, or anal sex. Both females and males should wear condoms during oral sex. Keep condoms in place from the beginning to the end of sexual activity. Use a latex condom, if possible. Latex condoms offer the best protection. Use only water-based lubricants with a condom. Using petroleum-based lubricants or oils will weaken the condom and increase the chance that it will break. Ways your health care provider can help you practice safe sex  See your health care provider for regular screenings, exams, and tests for STIs. Talk with your health care provider about what kind of birth control (contraception) is best for you. Get vaccinated against hepatitis B and human papillomavirus (HPV). If you are at risk of being infected with HIV (human immunodeficiency virus), talk with  your health care provider about taking a prescription medicine to prevent HIV infection. You are at risk for HIV if you: Are a man who has sex with other men. Are sexually active with more than one partner. Take drugs by injection. Have a sex partner who has HIV. Have unprotected sex. Have sex with someone who has sex with both men and women. Have had an STI. Follow these instructions at home: Take over-the-counter and prescription medicines only as told by your health care provider. Keep all follow-up visits. This is important. Where to find more information Centers for Disease Control and Prevention: FootballExhibition.com.br Planned Parenthood: www.plannedparenthood.org Office on [employer] [facility]: http://hoffman.com/ Summary Practicing safe sex means taking steps before and during sex to reduce your risk getting an STI, giving your partner an STI, and having an unwanted or unplanned pregnancy. Before having sex with a new partner, talk to your partner about past partners, past STIs, and drug use. Use a condom every time you have vaginal, oral, or anal sex. Both females and males should wear condoms during oral sex. Check your body regularly for sores, blisters, rashes, or unusual discharge. If you notice any of these problems, visit your health care provider. See your health care provider for regular screenings, exams, and tests for STIs. This information is not intended to replace advice given to you by your health care provider. Make sure you discuss any questions you have with your health care provider. Document Revised: 12/01/2019 Document Reviewed: 12/01/2019 Elsevier Patient Education  2023 [employer].

## 2022-02-27 LAB — URINE CULTURE

## 2022-02-27 LAB — HEPATITIS B SURFACE ANTIBODY,QUALITATIVE: Hep B S Ab: NONREACTIVE

## 2022-02-27 LAB — RPR: RPR Ser Ql: NONREACTIVE

## 2022-02-27 LAB — HEPATITIS C ANTIBODY: Hepatitis C Ab: NONREACTIVE

## 2022-02-27 LAB — HIV ANTIBODY (ROUTINE TESTING W REFLEX): HIV 1&2 Ab, 4th Generation: NONREACTIVE

## 2022-02-28 LAB — CERVICOVAGINAL ANCILLARY ONLY
Bacterial Vaginitis (gardnerella): NEGATIVE
Candida Glabrata: NEGATIVE
Candida Vaginitis: POSITIVE — AB
Chlamydia: NEGATIVE
Comment: NEGATIVE
Comment: NEGATIVE
Comment: NEGATIVE
Comment: NEGATIVE
Comment: NEGATIVE
Comment: NORMAL
Neisseria Gonorrhea: NEGATIVE
Trichomonas: NEGATIVE

## 2022-02-28 NOTE — Progress Notes (Signed)
She is not protected against hepatitis B and will need to be re- vaccinated for this, hepatitis B series. She can discuss this with [facility] and get their advice as well or come in here for the vaccines.  She is negative for HIV, syphilis and hepatitis C. I am still waiting on the swab results.  If she is still having urinary symptoms, I will send in an antibiotic

## 2022-04-28 ENCOUNTER — Other Ambulatory Visit (HOSPITAL_COMMUNITY): Payer: Self-pay

## 2022-04-28 ENCOUNTER — Ambulatory Visit (INDEPENDENT_AMBULATORY_CARE_PROVIDER_SITE_OTHER): Payer: BC Managed Care – PPO

## 2022-04-28 ENCOUNTER — Other Ambulatory Visit: Payer: Self-pay

## 2022-04-28 ENCOUNTER — Ambulatory Visit (INDEPENDENT_AMBULATORY_CARE_PROVIDER_SITE_OTHER): Payer: BC Managed Care – PPO | Admitting: Pharmacist

## 2022-04-28 DIAGNOSIS — Z79899 Other long term (current) drug therapy: Secondary | ICD-10-CM | POA: Diagnosis not present

## 2022-04-28 DIAGNOSIS — Z23 Encounter for immunization: Secondary | ICD-10-CM

## 2022-04-28 DIAGNOSIS — Z3202 Encounter for pregnancy test, result negative: Secondary | ICD-10-CM | POA: Diagnosis not present

## 2022-04-28 LAB — POCT URINE PREGNANCY: Preg Test, Ur: NEGATIVE

## 2022-04-28 NOTE — Progress Notes (Signed)
HPI: Christy Thomas is a 26 y.o. female who presents to the [facility] clinic for Apretude administration and HIV PrEP follow up.  Patient Active Problem List   Diagnosis Date Noted   Acute cystitis with hematuria 01/25/2022   Dizziness 01/13/2022   Infection due to Neisseria gonorrhoeae 11/27/2021   Recurrent tonsillitis 11/23/2021   Acute pharyngitis 11/23/2021   Anxiety 11/23/2021   Bipolar 2 disorder, major depressive episode ([place]) 11/23/2021   Panic attacks 11/23/2021   Lymphadenopathy 11/23/2021   Anemia 11/23/2021   ESBL (extended spectrum beta-lactamase) producing bacteria infection 10/18/2021   PICC (peripherally inserted central catheter) in place 10/18/2021   Normocytic anemia 09/30/2021   Acute pyelonephritis 09/28/2021   Hypokalemia 09/28/2021   Urinary tract infection due to extended-spectrum beta lactamase (ESBL) producing Escherichia coli 09/28/2021    Patient's Medications  New Prescriptions   No medications on file  Previous Medications   ACETAMINOPHEN (TYLENOL) 500 MG TABLET    Take 1,000 mg by mouth every 6 (six) hours as needed for headache (pain).   EMTRICITABINE-TENOFOVIR (TRUVADA) 200-300 MG TABLET    Take 1 tablet by mouth daily.   IBUPROFEN (ADVIL) 200 MG TABLET    Take 400 mg by mouth every 6 (six) hours as needed for headache (pain).   MULTIPLE VITAMIN (MULTIVITAMIN WITH MINERALS) TABS TABLET    Take 1 tablet by mouth every morning.   NORETHINDRONE-ETHINYL ESTRADIOL-FE (LOESTRIN FE) 1-20 MG-MCG TABLET    Take 1 tablet by mouth every morning.   VALACYCLOVIR (VALTREX) 500 MG TABLET    Take 500 mg by mouth every morning.  Modified Medications   No medications on file  Discontinued Medications   No medications on file    Allergies: No Known Allergies  Past Medical History: Past Medical History:  Diagnosis Date   Gonorrhea 11/27/2021    Social History: Social History   Socioeconomic History   Marital status: Single    Spouse name: Not  on file   Number of children: 0   Years of education: Not on file   Highest education level: Not on file  Occupational History   Not on file  Tobacco Use   Smoking status: Never   Smokeless tobacco: Never  Vaping Use   Vaping Use: Never used  Substance and Sexual Activity   Alcohol use: Yes    Alcohol/week: 3.0 standard drinks of alcohol    Types: 3 Shots of liquor per week    Comment: less than once a wk   Drug use: Not Currently    Types: Marijuana   Sexual activity: Not on file  Other Topics Concern   Not on file  Social History Narrative   Not on file   Social Determinants of Health   Financial Resource Strain: Not on file  Food Insecurity: Not on file  Transportation Needs: Not on file  Physical Activity: Not on file  Stress: Not on file  Social Connections: Not on file    Labs: Lab Results  Component Value Date   HIV1RNAQUANT Not Detected 01/27/2022    RPR and STI Lab Results  Component Value Date   LABRPR NON-REACTIVE 02/24/2022   LABRPR NON-REACTIVE 11/23/2021    STI Results GC CT  02/24/2022  9:55 AM Negative  Negative   01/25/2022  1:50 PM  Negative    Negative   01/06/2022 12:37 PM Positive  Negative   12/06/2021  1:55 PM  Negative   11/23/2021 10:51 AM  Negative   09/25/2021 11:19  AM Negative  Negative   08/26/2021  1:37 PM Negative  Negative   10/29/2020  6:26 PM Negative  Negative     Hepatitis B Lab Results  Component Value Date   HEPBSAB NON-REACTIVE 02/24/2022   HEPBSAG NON-REACTIVE 01/27/2022   Hepatitis C Lab Results  Component Value Date   HEPCAB NON-REACTIVE 02/24/2022   Hepatitis A Lab Results  Component Value Date   HAV NON-REACTIVE 01/27/2022   Lipids: No results found for: "CHOL", "TRIG", "HDL", "CHOLHDL", "VLDL", "LDLCALC"  Current PrEP Regimen: None (never started Truvada)   Assessment: Christy Thomas presents today for HIV PrEP follow up. Unfortunately, she shares that she never started Truvada. When she went to pick  up Truvada from [facility] she was told it would be a lot of money and that it would need to be filled at a specialty pharmacy. She also shares that she is now interested in starting Apretude for PrEP. She received STI screening 2 days ago and this was negative per her report. Informed Christy Thomas who will start the prior authorization process. Also discussed that she is due for annual flu vaccine, updated COVID-19 vaccine, hepatitis A and B vaccines as she does not have immunity, and second Gardasil-9 vaccine which she agrees to receive today.   Counseled that Apretude is one intramuscular injection in the gluteal muscle for each visit. Explained that the second injection is 30 days after the initial injection then every 2 months thereafter. Discussed follow up appointments moving forward. Screened for acute HIV symptoms such as fatigue, muscle aches, rash, sore throat, lymphadenopathy, headache, night sweats, nausea/vomiting/diarrhea, and fever. Denies any symptoms.   Explained that showing up to injection appointments is very important and warned that if appointments are missed, protection will be minimal and the risk of acquiring HIV becomes much higher. Counseled on possible side effects associated with the injections such as injection site pain, which is usually mild to moderate in nature, injection site nodules, and injection site reactions. Asked to call the clinic or send me a mychart message if they experience any issues. Advised that she can take Motrin or Tylenol for injection site pain if needed. She may also pre-treat with Motrin or Tylenol 30-45 minutes before scheduled appointments.    Plan: - Check HIV Ab, urine pregnancy test - Administered annual flu vaccine, updated COVID-19 vaccine, Havrix, Heplisav-B, second Gardasil-9 vaccine - Scheduled follow up for 05/31/22 with Cassie to receive second Heplisav-B. We will schedule her for Apretude once it is approved.  - Call with any issues or  questions  Christy Thomas, PharmD PGY1 Pharmacy Resident   04/28/2022  1:10 PM

## 2022-05-01 LAB — HIV ANTIBODY (ROUTINE TESTING W REFLEX): HIV 1&2 Ab, 4th Generation: NONREACTIVE

## 2022-05-09 ENCOUNTER — Other Ambulatory Visit (HOSPITAL_COMMUNITY): Payer: Self-pay

## 2022-05-11 ENCOUNTER — Ambulatory Visit (INDEPENDENT_AMBULATORY_CARE_PROVIDER_SITE_OTHER): Payer: BC Managed Care – PPO | Admitting: Family Medicine

## 2022-05-11 ENCOUNTER — Encounter: Payer: Self-pay | Admitting: Family Medicine

## 2022-05-11 VITALS — BP 112/76 | HR 100 | Temp 97.8°F | Ht 62.0 in | Wt 136.0 lb

## 2022-05-11 DIAGNOSIS — J029 Acute pharyngitis, unspecified: Secondary | ICD-10-CM | POA: Diagnosis not present

## 2022-05-11 DIAGNOSIS — Z113 Encounter for screening for infections with a predominantly sexual mode of transmission: Secondary | ICD-10-CM | POA: Diagnosis not present

## 2022-05-11 DIAGNOSIS — R52 Pain, unspecified: Secondary | ICD-10-CM

## 2022-05-11 DIAGNOSIS — R0981 Nasal congestion: Secondary | ICD-10-CM | POA: Diagnosis not present

## 2022-05-11 LAB — POC COVID19 BINAXNOW: SARS Coronavirus 2 Ag: NEGATIVE

## 2022-05-11 LAB — POCT RAPID STREP A (OFFICE): Rapid Strep A Screen: NEGATIVE

## 2022-05-11 MED ORDER — PREDNISONE 20 MG PO TABS: 40.0000 mg | ORAL_TABLET | Freq: Every day | ORAL | 0 refills | Status: DC

## 2022-05-11 NOTE — Progress Notes (Signed)
Subjective:  Christy Thomas is a 26 y.o. female who presents for a 2-3 day hx of nasal congestion, sore throat and body aches.  She requests STD testing including throat swab for gonorrhea   Denies fever, chills, dizziness, cough, shortness of breath, abdominal pain, N/V/D. No urinary symptoms. No vaginal discharge.   Treatment to date:  ibuprofen .   No other aggravating or relieving factors.  No other c/o.  ROS as in subjective.   Objective: Vitals:   05/11/22 1617  BP: 112/76  Pulse: 100  Temp: 97.8 F (36.6 C)  SpO2: 100%    General appearance: Alert, WD/WN, no distress                             Skin: warm, no rash                           Head: no sinus tenderness                            Eyes: conjunctiva normal, corneas clear, PERRLA                            Ears: pearly TMs, external ear canals normal                          Nose: septum midline, no drainage              Mouth/throat: MMM, tongue normal, moderate pharyngeal erythema                           Neck: supple, no adenopathy, no thyromegaly, nontender                          Heart: RRR, normal S1, S2, no murmurs                         Lungs: CTA bilaterally, no wheezes, rales, or rhonchi      Assessment: Acute pharyngitis, unspecified etiology - Plan: CBC with Differential/Platelet, CBC with Differential/Platelet  Routine screening for STI (sexually transmitted infection) - Plan: RPR, HIV Antibody (routine testing w rflx), GC/Chlamydia Probe Amp, GC/Chlamydia Probe Amp, GC/Chlamydia Probe Amp, GC/Chlamydia Probe Amp, HIV Antibody (routine testing w rflx), RPR  Nasal congestion  Body aches   Plan: Covid test negative  Strep test negative  GC/CT throat swab done and patient performed a self vaginal swab. Awaiting results.  HIV, RPR ordered as well as CBC.  Oral steroids prescribed. Salt water gargles and OTC symptomatic treatment recommended.  Follow up pending results or if worsening.

## 2022-05-11 NOTE — Patient Instructions (Signed)
You appear to have a viral illness today.  You are negative for strep throat and negative for COVID.  I prescribed oral steroids for you to take for the next 3 days.  Take this with food and a full glass of water.  You may continue taking ibuprofen as needed.  Use salt water gargles  Try Mucinex and Flonase if needed for congestion.  Drink plenty of water  We will be in touch with your results.  Follow-up if you are not getting worse  If you develop any tongue, lip or throat swelling then call 911.

## 2022-05-12 LAB — CBC WITH DIFFERENTIAL/PLATELET
Basophils Absolute: 0.1 10*3/uL (ref 0.0–0.1)
Basophils Relative: 0.6 % (ref 0.0–3.0)
Eosinophils Absolute: 0.1 10*3/uL (ref 0.0–0.7)
Eosinophils Relative: 1.8 % (ref 0.0–5.0)
HCT: 38.1 % (ref 36.0–46.0)
Hemoglobin: 12.9 g/dL (ref 12.0–15.0)
Lymphocytes Relative: 27.2 % (ref 12.0–46.0)
Lymphs Abs: 2.3 10*3/uL (ref 0.7–4.0)
MCHC: 33.9 g/dL (ref 30.0–36.0)
MCV: 88.8 fl (ref 78.0–100.0)
Monocytes Absolute: 0.8 10*3/uL (ref 0.1–1.0)
Monocytes Relative: 9.7 % (ref 3.0–12.0)
Neutro Abs: 5 10*3/uL (ref 1.4–7.7)
Neutrophils Relative %: 60.7 % (ref 43.0–77.0)
Platelets: 362 10*3/uL (ref 150.0–400.0)
RBC: 4.29 Mil/uL (ref 3.87–5.11)
RDW: 13.2 % (ref 11.5–15.5)
WBC: 8.3 10*3/uL (ref 4.0–10.5)

## 2022-05-12 LAB — HIV ANTIBODY (ROUTINE TESTING W REFLEX): HIV 1&2 Ab, 4th Generation: NONREACTIVE

## 2022-05-12 LAB — RPR: RPR Ser Ql: NONREACTIVE

## 2022-05-15 ENCOUNTER — Telehealth: Payer: Self-pay

## 2022-05-15 LAB — GC/CHLAMYDIA PROBE AMP
Chlamydia trachomatis, NAA: NEGATIVE
Neisseria Gonorrhoeae by PCR: NEGATIVE

## 2022-05-15 NOTE — Progress Notes (Signed)
Called patient to let her know she is approved for Apretude at this time. She would like to receive her first injection on her already scheduled appointment for vaccines on 11/22. Thanks! Estill Bamberg

## 2022-05-15 NOTE — Telephone Encounter (Signed)
[facility] Patient Advocate Encounter  [phone] & [phone] will not need a PA or PRE-CERT.  Ref #[id number]  [employer]  [phone]  Patient will have a $20.00 office specialist copay.   Patient is enrolled in [place], [place] Patient [facility] for Infectious Disease Phone: [phone] Fax:  [phone]

## 2022-05-15 NOTE — Progress Notes (Signed)
Thank you :)

## 2022-05-17 LAB — GC/CHLAMYDIA PROBE AMP
Chlamydia trachomatis, NAA: NEGATIVE
Neisseria Gonorrhoeae by PCR: NEGATIVE

## 2022-05-30 ENCOUNTER — Other Ambulatory Visit: Payer: Self-pay

## 2022-05-30 ENCOUNTER — Encounter (HOSPITAL_COMMUNITY): Payer: Self-pay | Admitting: *Deleted

## 2022-05-30 ENCOUNTER — Ambulatory Visit (HOSPITAL_COMMUNITY)
Admission: EM | Admit: 2022-05-30 | Discharge: 2022-05-30 | Disposition: A | Discharge status: Home / Self Care | Payer: BC Managed Care – PPO | Attending: Emergency Medicine | Admitting: Emergency Medicine

## 2022-05-30 DIAGNOSIS — R509 Fever, unspecified: Secondary | ICD-10-CM | POA: Diagnosis not present

## 2022-05-30 DIAGNOSIS — R1110 Vomiting, unspecified: Secondary | ICD-10-CM

## 2022-05-30 DIAGNOSIS — B9789 Other viral agents as the cause of diseases classified elsewhere: Secondary | ICD-10-CM | POA: Diagnosis not present

## 2022-05-30 DIAGNOSIS — J039 Acute tonsillitis, unspecified: Secondary | ICD-10-CM

## 2022-05-30 DIAGNOSIS — J028 Acute pharyngitis due to other specified organisms: Secondary | ICD-10-CM | POA: Diagnosis not present

## 2022-05-30 DIAGNOSIS — Z1152 Encounter for screening for COVID-19: Secondary | ICD-10-CM | POA: Diagnosis not present

## 2022-05-30 DIAGNOSIS — J029 Acute pharyngitis, unspecified: Secondary | ICD-10-CM | POA: Diagnosis not present

## 2022-05-30 HISTORY — DX: Urinary tract infection, site not specified: N39.0

## 2022-05-30 LAB — RESP PANEL BY RT-PCR (FLU A&B, COVID) ARPGX2
Influenza A by PCR: NEGATIVE
Influenza B by PCR: NEGATIVE
SARS Coronavirus 2 by RT PCR: NEGATIVE

## 2022-05-30 LAB — POCT URINALYSIS DIPSTICK, ED / UC
Glucose, UA: NEGATIVE mg/dL
Hgb urine dipstick: NEGATIVE
Ketones, ur: >=160 mg/dL — AB
Leukocytes,Ua: NEGATIVE
Nitrite: NEGATIVE
Protein, ur: 100 mg/dL — AB
Specific Gravity, Urine: 1.025 (ref 1.005–1.030)
Urobilinogen, UA: 0.2 mg/dL (ref 0.0–1.0)
pH: 6 (ref 5.0–8.0)

## 2022-05-30 LAB — POC URINE PREG, ED: Preg Test, Ur: NEGATIVE

## 2022-05-30 LAB — POCT INFECTIOUS MONO SCREEN, ED / UC: Mono Screen: NEGATIVE

## 2022-05-30 LAB — POCT RAPID STREP A, ED / UC: Streptococcus, Group A Screen (Direct): NEGATIVE

## 2022-05-30 MED ORDER — IBUPROFEN 100 MG/5ML PO SUSP
400.0000 mg | Freq: Once | ORAL | Status: AC
  Administered 2022-05-30: 400 mg via ORAL

## 2022-05-30 MED ORDER — IBUPROFEN 100 MG/5ML PO SUSP
ORAL | Status: AC
  Filled 2022-05-30: qty 20

## 2022-05-30 MED ORDER — LIDOCAINE VISCOUS HCL 2 % MT SOLN: 15.0000 mL | OROMUCOSAL | 0 refills | Status: DC | PRN

## 2022-05-30 NOTE — Discharge Instructions (Signed)
Continue symptomatic care at home.  Alternate Tylenol and ibuprofen every 4-6 hours for pain and swelling.  Use the lidocaine gargle and spit as well.  Make sure you are drinking lots of fluids.  If at any point your symptoms worsen you need to be evaluated in the emergency department.

## 2022-05-30 NOTE — ED Provider Notes (Signed)
[facility]    CSN: [id number] Arrival date & time: 05/30/22  1317     History   Chief Complaint Chief Complaint  Patient presents with   Abdominal Pain   Fever   Lymphadenopathy    HPI Christy Thomas is a 26 y.o. female.  Presents with swollen lymph nodes that began last night, along with sore throat and pain with swallowing  7/10 pain in clinic today Took ibuprofen last night, tylenol today Tmax 102.4 yesterday Some body aches  No urinary symptoms. Reports recent UTI treated with macrobid. Denies abdominal pain. One episode of vomiting early this morning but none since. No diarrhea Tolerates fluids by mouth, decreased appetite.  No known sick contacts. No recent travel  Past Medical History:  Diagnosis Date   Gonorrhea 11/27/2021   UTI (urinary tract infection)     Patient Active Problem List   Diagnosis Date Noted   Acute cystitis with hematuria 01/25/2022   Dizziness 01/13/2022   Infection due to Neisseria gonorrhoeae 11/27/2021   Recurrent tonsillitis 11/23/2021   Acute pharyngitis 11/23/2021   Anxiety 11/23/2021   Bipolar 2 disorder, major depressive episode (HCC) 11/23/2021   Panic attacks 11/23/2021   Lymphadenopathy 11/23/2021   Anemia 11/23/2021   ESBL (extended spectrum beta-lactamase) producing bacteria infection 10/18/2021   PICC (peripherally inserted central catheter) in place 10/18/2021   Normocytic anemia 09/30/2021   Acute pyelonephritis 09/28/2021   Hypokalemia 09/28/2021   Urinary tract infection due to extended-spectrum beta lactamase (ESBL) producing Escherichia coli 09/28/2021    Past Surgical History:  Procedure Laterality Date   NO PAST SURGERIES      OB History   No obstetric history on file.      Home Medications    Prior to Admission medications   Medication Sig Start Date End Date Taking? Authorizing Provider  lidocaine (XYLOCAINE) 2 % solution Use as directed 15 mLs in the mouth or throat as needed for  mouth pain. 05/30/22  Yes Lynita Groseclose, Lurena Joiner, PA-C  norethindrone-ethinyl estradiol-FE (LOESTRIN FE) 1-20 MG-MCG tablet Take 1 tablet by mouth every morning.   Yes [provider]  UNKNOWN TO PATIENT ?Nitrofurantoin for UTI   Yes [provider]  valACYclovir (VALTREX) 500 MG tablet Take 500 mg by mouth every morning. 12/15/21  Yes [provider]  ibuprofen (ADVIL) 200 MG tablet Take 400 mg by mouth every 6 (six) hours as needed for headache (pain).    [provider]  Multiple Vitamin (MULTIVITAMIN WITH MINERALS) TABS tablet Take 1 tablet by mouth every morning.    [provider]    Family History Family History  Problem Relation Age of Onset   Lymphoma Mother     Social History Social History   Tobacco Use   Smoking status: Never   Smokeless tobacco: Never  Vaping Use   Vaping Use: Never used  Substance Use Topics   Alcohol use: Not Currently    Comment: occasionally   Drug use: Not Currently    Types: Marijuana     Allergies   Patient has no known allergies.   Review of Systems Review of Systems Per HPI  Physical Exam Triage Vital Signs ED Triage Vitals  Enc Vitals Group     BP 05/30/22 1337 114/80     Pulse Rate 05/30/22 1337 (!) 130     Resp 05/30/22 1337 20     Temp 05/30/22 1337 100.3 F (37.9 C)     Temp Source 05/30/22 1337 Oral  SpO2 05/30/22 1337 97 %     Weight --      Height --      Head Circumference --      Peak Flow --      Pain Score 05/30/22 1340 7     Pain Loc --      Pain Edu? --      Excl. in [place]? --    No data found.  Updated Vital Signs BP 114/80   Pulse (!) 110   Temp 99.2 F (37.3 C) (Oral)   Resp 20   SpO2 97%   Physical Exam Vitals and nursing note reviewed.  Constitutional:      General: She is not in acute distress.    Appearance: She is not ill-appearing.     Comments: Normal phonation, tolerates secretions, no respiratory distress  HENT:     Mouth/Throat:     Mouth:  Mucous membranes are moist.     Pharynx: Oropharynx is clear. Uvula midline. Posterior oropharyngeal erythema present.     Tonsils: Tonsillar exudate present. No tonsillar abscesses. 3+ on the right. 3+ on the left.  Eyes:     Conjunctiva/sclera: Conjunctivae normal.  Neck:     Comments: Tender cervical LAD bilat Cardiovascular:     Rate and Rhythm: Normal rate and regular rhythm.     Pulses: Normal pulses.     Heart sounds: Normal heart sounds.  Pulmonary:     Effort: Pulmonary effort is normal.     Breath sounds: Normal breath sounds.  Abdominal:     Tenderness: There is no abdominal tenderness. There is no right CVA tenderness, left CVA tenderness, guarding or rebound.  Lymphadenopathy:     Cervical: Cervical adenopathy present.  Skin:    General: Skin is warm and dry.     Findings: No rash.  Neurological:     Mental Status: She is alert and oriented to person, place, and time.     UC Treatments / Results  Labs (all labs ordered are listed, but only abnormal results are displayed) Labs Reviewed  POCT URINALYSIS DIPSTICK, ED / UC - Abnormal; Notable for the following components:      Result Value   Bilirubin Urine SMALL (*)    Ketones, ur >=160 (*)    Protein, ur 100 (*)    All other components within normal limits  CULTURE, GROUP A STREP ([place])  RESP PANEL BY RT-PCR (FLU A&B, COVID) ARPGX2  POCT RAPID STREP A, ED / UC  POC URINE PREG, ED  POCT INFECTIOUS MONO SCREEN, ED / UC    EKG  Radiology No results found.  Procedures Procedures   Medications Ordered in UC Medications  ibuprofen (ADVIL) 100 MG/5ML suspension 400 mg (400 mg Oral Given 05/30/22 1431)    Initial Impression / Assessment and Plan / UC Course  I have reviewed the triage vital signs and the nursing notes.  Pertinent labs & imaging results that were available during my care of the patient were reviewed by me and considered in my medical decision making (see chart for details).  Almost low  grade temp 100.3 on arrival with HR 130s Recheck without intervention reveals temp down to 99.2, HR down to 110. No signs of PTA on exam. Patient appears uncomfortable. Ibuprofen dose given with improvement in throat pain and body aches. Strep test negative. Culture pending.  Mono test negative.  Urinalysis without sign of infection, ketones present but to be expected with her decreased intake. No history  DM.  Covid/flu pending. Discussed symptomatic care, continue alternating tylenol/ibuprofen prn Can use lidocaine gargle and spit Discussed strict ED precautions for worsening symptoms  At this time suspect viral pharyngitis but instructed patient to return for re-evaluation as needed. Patient agrees to plan  Final Clinical Impressions(s) / UC Diagnoses   Final diagnoses:  Viral pharyngitis  Acute tonsillitis, unspecified etiology     Discharge Instructions      Continue symptomatic care at home.  Alternate Tylenol and ibuprofen every 4-6 hours for pain and swelling.  Use the lidocaine gargle and spit as well.  Make sure you are drinking lots of fluids.  If at any point your symptoms worsen you need to be evaluated in the emergency department.     ED Prescriptions     Medication Sig Dispense Auth. Provider   lidocaine (XYLOCAINE) 2 % solution Use as directed 15 mLs in the mouth or throat as needed for mouth pain. 100 mL Nivedita Mirabella, Wells Guiles, PA-C      PDMP not reviewed this encounter.   Kyra Leyland 05/30/22 1530

## 2022-05-30 NOTE — ED Triage Notes (Addendum)
C/O lymphadenopathy x 1 wk; started with vomiting yesterday. Denies sore throat, but c/o painful swallowing and talking. C/O fevers up to 102.4 onset yesterday. C/O slight cough and soreness to bilat trapezius area, HA. Has been taking acetaminophen (last dose @ approx 1230) and IBU (last dose yesterday). Also c/o intermittent left abd "shooting" pains; has been taking ?nitrofurantion for a UTI x7 days - pt states she is concerned as she has had resistant strains of bacteria causing UTI in past.

## 2022-05-30 NOTE — Progress Notes (Incomplete)
Date:  05/30/2022   HPI: Christy Thomas is a 26 y.o. female who presents to the [facility] pharmacy clinic for 2nd Hep B vaccination.  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 11/27/2021   Recurrent tonsillitis 11/23/2021   Acute pharyngitis 11/23/2021   Anxiety 11/23/2021   Bipolar 2 disorder, major depressive episode (HCC) 11/23/2021   Panic attacks 11/23/2021   Lymphadenopathy 11/23/2021   Anemia 11/23/2021   ESBL (extended spectrum beta-lactamase) producing bacteria infection 10/18/2021   PICC (peripherally inserted central catheter) in place 10/18/2021   Normocytic anemia 09/30/2021   Acute pyelonephritis 09/28/2021   Hypokalemia 09/28/2021   Urinary tract infection due to extended-spectrum beta lactamase (ESBL) producing Escherichia coli 09/28/2021    Patient's Medications  New Prescriptions   No medications on file  Previous Medications   EMTRICITABINE-TENOFOVIR (TRUVADA) 200-300 MG TABLET    Take 1 tablet by mouth daily.   IBUPROFEN (ADVIL) 200 MG TABLET    Take 400 mg by mouth every 6 (six) hours as needed for headache (pain).   MULTIPLE VITAMIN (MULTIVITAMIN WITH MINERALS) TABS TABLET    Take 1 tablet by mouth every morning.   NORETHINDRONE-ETHINYL ESTRADIOL-FE (LOESTRIN FE) 1-20 MG-MCG TABLET    Take 1 tablet by mouth every morning.   PREDNISONE (DELTASONE) 20 MG TABLET    Take 2 tablets (40 mg total) by mouth daily with breakfast.   UNKNOWN TO PATIENT    ?Nitrofurantoin for UTI   VALACYCLOVIR (VALTREX) 500 MG TABLET    Take 500 mg by mouth every morning.  Modified Medications   No medications on file  Discontinued Medications   No medications on file    Allergies: No Known Allergies  Past Medical History: Past Medical History:  Diagnosis Date   Gonorrhea 11/27/2021   UTI (urinary tract infection)     Social  History: Social History   Socioeconomic History   Marital status: Single    Spouse name: Not on file   Number of children: 0   Years of education: Not on file   Highest education level: Not on file  Occupational History   Not on file  Tobacco Use   Smoking status: Never   Smokeless tobacco: Never  Vaping Use   Vaping Use: Never used  Substance and Sexual Activity   Alcohol use: Not Currently    Comment: occasionally   Drug use: Not Currently    Types: Marijuana   Sexual activity: Not on file  Other Topics Concern   Not on file  Social History Narrative   Not on file   Social Determinants of Health   Financial Resource Strain: Not on file  Food Insecurity: Not on file  Transportation Needs: Not on file  Physical Activity: Not on file  Stress: Not on file  Social Connections: Not on file       01/27/2022    9:56 AM  [facility] HIV PREP FLOWSHEET RESULTS  Insurance Status Insured  How did you hear? friend recommended  Gender at birth Female  Gender identity cis-Female  Sex Partners Both men and women  # sex partners past 3-6 mos 6-8  Sex activity preferences Receptive;Oral  Condom use Yes  % condom use 80  Treated for STI? Yes  HIV symptoms? Sore throat;High Fever;Flu-like/Mono-like symptoms;Swollen lymph nodes in your neck  PrEP  Eligibility Yes  Preg status No  Breastfeeding? No  Paper work received? Yes    Labs:  SCr: Lab Results  Component Value Date   CREATININE 0.86 01/13/2022   CREATININE 1.11 (H) 01/06/2022   CREATININE 0.81 11/23/2021   CREATININE 0.82 09/29/2021   CREATININE 0.94 09/28/2021   HIV Lab Results  Component Value Date   HIV NON-REACTIVE 05/11/2022   HIV NON-REACTIVE 04/28/2022   HIV NON-REACTIVE 02/24/2022   HIV NON-REACTIVE 01/27/2022   HIV NON-REACTIVE 11/23/2021   Hepatitis B Lab Results  Component Value Date   HEPBSAB NON-REACTIVE 02/24/2022   HEPBSAG NON-REACTIVE 01/27/2022   Hepatitis C Lab Results  Component Value  Date   HEPCAB NON-REACTIVE 02/24/2022   Hepatitis A Lab Results  Component Value Date   HAV NON-REACTIVE 01/27/2022   RPR and STI Lab Results  Component Value Date   LABRPR NON-REACTIVE 05/11/2022   LABRPR NON-REACTIVE 02/24/2022   LABRPR NON-REACTIVE 11/23/2021    STI Results GC CT  Latest Ref Rng & Units  Negative  05/11/2022  4:36 PM  Negative   05/11/2022  4:29 PM  Negative   02/24/2022  9:55 AM Negative  Negative   01/25/2022  1:50 PM  Negative    Negative   01/06/2022 12:37 PM Positive  Negative   12/06/2021  1:55 PM  Negative   11/23/2021 10:51 AM  Negative   09/25/2021 11:19 AM Negative  Negative   08/26/2021  1:37 PM Negative  Negative   10/29/2020  6:26 PM Negative  Negative     Assessment: Christy Thomas presents today for her 2nd Hep B vaccine which she accepted. The Hep B vaccine was administered in the _____ deltoid.   She had a recent visit to urgent care for abdominal pain and a low-grade fever. She reports ____.   Plan: - Administer 2nd Hep B vaccine - Follow up on ____ for first Apretude injection - Administer 3rd HPV vaccine in January  Jacolyn Reedy, Student Pharm-D [facility] for Infectious Disease

## 2022-05-31 ENCOUNTER — Ambulatory Visit: Payer: BC Managed Care – PPO | Admitting: Pharmacist

## 2022-05-31 NOTE — Progress Notes (Incomplete)
HPI: Christy Thomas is a 26 y.o. female who presents to the [facility] pharmacy clinic for Apretude administration and HIV PrEP follow up and 2nd Hep B vaccine.  Patient Active Problem List   Diagnosis Date Noted   Acute cystitis with hematuria 01/25/2022   Dizziness 01/13/2022   Infection due to Neisseria gonorrhoeae 11/27/2021   Recurrent tonsillitis 11/23/2021   Acute pharyngitis 11/23/2021   Anxiety 11/23/2021   Bipolar 2 disorder, major depressive episode (HCC) 11/23/2021   Panic attacks 11/23/2021   Lymphadenopathy 11/23/2021   Anemia 11/23/2021   ESBL (extended spectrum beta-lactamase) producing bacteria infection 10/18/2021   PICC (peripherally inserted central catheter) in place 10/18/2021   Normocytic anemia 09/30/2021   Acute pyelonephritis 09/28/2021   Hypokalemia 09/28/2021   Urinary tract infection due to extended-spectrum beta lactamase (ESBL) producing Escherichia coli 09/28/2021    Patient's Medications  New Prescriptions   No medications on file  Previous Medications   IBUPROFEN (ADVIL) 200 MG TABLET    Take 400 mg by mouth every 6 (six) hours as needed for headache (pain).   LIDOCAINE (XYLOCAINE) 2 % SOLUTION    Use as directed 15 mLs in the mouth or throat as needed for mouth pain.   MULTIPLE VITAMIN (MULTIVITAMIN WITH MINERALS) TABS TABLET    Take 1 tablet by mouth every morning.   NORETHINDRONE-ETHINYL ESTRADIOL-FE (LOESTRIN FE) 1-20 MG-MCG TABLET    Take 1 tablet by mouth every morning.   UNKNOWN TO PATIENT    ?Nitrofurantoin for UTI   VALACYCLOVIR (VALTREX) 500 MG TABLET    Take 500 mg by mouth every morning.  Modified Medications   No medications on file  Discontinued Medications   No medications on file    Allergies: No Known Allergies  Past Medical History: Past Medical History:  Diagnosis Date   Gonorrhea 11/27/2021   UTI (urinary tract infection)     Social History: Social History   Socioeconomic History   Marital status: Single     Spouse name: Not on file   Number of children: 0   Years of education: Not on file   Highest education level: Not on file  Occupational History   Not on file  Tobacco Use   Smoking status: Never   Smokeless tobacco: Never  Vaping Use   Vaping Use: Never used  Substance and Sexual Activity   Alcohol use: Not Currently    Comment: occasionally   Drug use: Not Currently    Types: Marijuana   Sexual activity: Not on file  Other Topics Concern   Not on file  Social History Narrative   Not on file   Social Determinants of Health   Financial Resource Strain: Not on file  Food Insecurity: Not on file  Transportation Needs: Not on file  Physical Activity: Not on file  Stress: Not on file  Social Connections: Not on file    Labs: Lab Results  Component Value Date   HIV1RNAQUANT Not Detected 01/27/2022    RPR and STI Lab Results  Component Value Date   LABRPR NON-REACTIVE 05/11/2022   LABRPR NON-REACTIVE 02/24/2022   LABRPR NON-REACTIVE 11/23/2021    STI Results GC CT  Latest Ref Rng & Units  Negative  05/11/2022  4:36 PM  Negative   05/11/2022  4:29 PM  Negative   02/24/2022  9:55 AM Negative  Negative   01/25/2022  1:50 PM  Negative    Negative   01/06/2022 12:37 PM Positive  Negative   12/06/2021  1:55 PM  Negative   11/23/2021 10:51 AM  Negative   09/25/2021 11:19 AM Negative  Negative   08/26/2021  1:37 PM Negative  Negative   10/29/2020  6:26 PM Negative  Negative     Hepatitis B Lab Results  Component Value Date   HEPBSAB NON-REACTIVE 02/24/2022   HEPBSAG NON-REACTIVE 01/27/2022   Hepatitis C Lab Results  Component Value Date   HEPCAB NON-REACTIVE 02/24/2022   Hepatitis A Lab Results  Component Value Date   HAV NON-REACTIVE 01/27/2022   Lipids: No results found for: "CHOL", "TRIG", "HDL", "CHOLHDL", "VLDL", "LDLCALC"  Current PrEP Regimen: None  TARGET DATE: The 22nd of the month  Assessment: Christy Thomas presents today for their  first initiation injection of Apretude and to follow up for HIV PrEP and her 2nd Hep B vaccine. [ASK ABOUT ACTIVITY< PARTNERS AND SYMPTOMS BEFORE CONSIDERING APRETUDE OR GETTING HIV VIRAL LOAD]  Counseled that Apretude is one intramuscular injection in the gluteal muscle for each visit. Explained that the second injection is 30 days after the initial injection then every 2 months thereafter. Discussed follow up appointments moving forward. Screened for acute HIV symptoms such as fatigue, muscle aches, rash, sore throat, lymphadenopathy, headache, night sweats, nausea/vomiting/diarrhea, and fever. Denies any symptoms.   Explained that showing up to injection appointments is very important and warned that if appointments are missed, protection will be minimal and the risk of acquiring HIV becomes much higher. Counseled on possible side effects associated with the injections such as injection site pain, which is usually mild to moderate in nature, injection site nodules, and injection site reactions. Asked to call the clinic or send me a mychart message if they experience any issues. Advised that she can take Motrin or Tylenol for injection site pain if needed. She may also pre-treat with Motrin or Tylenol 30-45 minutes before scheduled appointments.   Per [employer] guidelines, a rapid HIV test should be drawn prior to Apretude administration. Due to state shortage of rapid HIV tests, this is temporarily unable to be done. Per decision from [place], we will proceed with Apretude administration at this time without a negative rapid HIV test beforehand. HIV RNA was collected today and is in process.  She is eligible for her 2,d Hep B vaccine which she ______ administration for today. Was administered in the left/right deltoid.  Administered cabotegravir 600mg /71mL in the left/right upper outer quadrant of the gluteal muscle. Monitored patient for 10 minutes after injection. Injection was tolerated  well without issue. Will make follow up appointments for second initiation injection in 30 days and then maintenance injections every 2 months thereafter.   Plan: - Administered Hep B vaccine - First Apretude injection administered - Second initiation injection scheduled for *** with Estill Bamberg - Maintenance injections scheduled for *** with Estill Bamberg - Call with any issues or questions  Tanja Port, [place] for Infectious Disease

## 2022-06-01 LAB — CULTURE, GROUP A STREP (THRC)

## 2022-06-05 NOTE — Progress Notes (Signed)
Date:  06/05/2022   HPI: Christy Thomas is a 26 y.o. female who presents to the [facility] pharmacy clinic for her 2nd Hep B vaccine.  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 11/27/2021   Recurrent tonsillitis 11/23/2021   Acute pharyngitis 11/23/2021   Anxiety 11/23/2021   Bipolar 2 disorder, major depressive episode (HCC) 11/23/2021   Panic attacks 11/23/2021   Lymphadenopathy 11/23/2021   Anemia 11/23/2021   ESBL (extended spectrum beta-lactamase) producing bacteria infection 10/18/2021   PICC (peripherally inserted central catheter) in place 10/18/2021   Normocytic anemia 09/30/2021   Acute pyelonephritis 09/28/2021   Hypokalemia 09/28/2021   Urinary tract infection due to extended-spectrum beta lactamase (ESBL) producing Escherichia coli 09/28/2021    Patient's Medications  New Prescriptions   No medications on file  Previous Medications   IBUPROFEN (ADVIL) 200 MG TABLET    Take 400 mg by mouth every 6 (six) hours as needed for headache (pain).   LIDOCAINE (XYLOCAINE) 2 % SOLUTION    Use as directed 15 mLs in the mouth or throat as needed for mouth pain.   MULTIPLE VITAMIN (MULTIVITAMIN WITH MINERALS) TABS TABLET    Take 1 tablet by mouth every morning.   NORETHINDRONE-ETHINYL ESTRADIOL-FE (LOESTRIN FE) 1-20 MG-MCG TABLET    Take 1 tablet by mouth every morning.   UNKNOWN TO PATIENT    ?Nitrofurantoin for UTI   VALACYCLOVIR (VALTREX) 500 MG TABLET    Take 500 mg by mouth every morning.  Modified Medications   No medications on file  Discontinued Medications   No medications on file    Allergies: No Known Allergies  Past Medical History: Past Medical History:  Diagnosis Date   Gonorrhea 11/27/2021   UTI (urinary tract infection)     Social History: Social History   Socioeconomic History   Marital status: Single     Spouse name: Not on file   Number of children: 0   Years of education: Not on file   Highest education level: Not on file  Occupational History   Not on file  Tobacco Use   Smoking status: Never   Smokeless tobacco: Never  Vaping Use   Vaping Use: Never used  Substance and Sexual Activity   Alcohol use: Not Currently    Comment: occasionally   Drug use: Not Currently    Types: Marijuana   Sexual activity: Not on file  Other Topics Concern   Not on file  Social History Narrative   Not on file   Social Determinants of Health   Financial Resource Strain: Not on file  Food Insecurity: Not on file  Transportation Needs: Not on file  Physical Activity: Not on file  Stress: Not on file  Social Connections: Not on file       01/27/2022    9:56 AM  [facility] HIV PREP FLOWSHEET RESULTS  Insurance Status Insured  How did you hear? friend recommended  Gender at birth Female  Gender identity cis-Female  Sex Partners Both men and women  # sex partners past 3-6 mos 6-8  Sex activity preferences Receptive;Oral  Condom use Yes  % condom use 80  Treated for STI? Yes  HIV symptoms? Sore throat;High Fever;Flu-like/Mono-like symptoms;Swollen lymph nodes in your neck  PrEP Eligibility Yes  Preg status No  Breastfeeding? No  Paper  work received? Yes    Labs:  SCr: Lab Results  Component Value Date   CREATININE 0.86 01/13/2022   CREATININE 1.11 (H) 01/06/2022   CREATININE 0.81 11/23/2021   CREATININE 0.82 09/29/2021   CREATININE 0.94 09/28/2021   HIV Lab Results  Component Value Date   HIV NON-REACTIVE 05/11/2022   HIV NON-REACTIVE 04/28/2022   HIV NON-REACTIVE 02/24/2022   HIV NON-REACTIVE 01/27/2022   HIV NON-REACTIVE 11/23/2021   Hepatitis B Lab Results  Component Value Date   HEPBSAB NON-REACTIVE 02/24/2022   HEPBSAG NON-REACTIVE 01/27/2022   Hepatitis C Lab Results  Component Value Date   HEPCAB NON-REACTIVE 02/24/2022   Hepatitis A Lab Results   Component Value Date   HAV NON-REACTIVE 01/27/2022   RPR and STI Lab Results  Component Value Date   LABRPR NON-REACTIVE 05/11/2022   LABRPR NON-REACTIVE 02/24/2022   LABRPR NON-REACTIVE 11/23/2021    STI Results GC CT  Latest Ref Rng & Units  Negative  05/11/2022  4:36 PM  Negative   05/11/2022  4:29 PM  Negative   02/24/2022  9:55 AM Negative  Negative   01/25/2022  1:50 PM  Negative    Negative   01/06/2022 12:37 PM Positive  Negative   12/06/2021  1:55 PM  Negative   11/23/2021 10:51 AM  Negative   09/25/2021 11:19 AM Negative  Negative   08/26/2021  1:37 PM Negative  Negative   10/29/2020  6:26 PM Negative  Negative     Assessment: Christy Thomas presents for her 2nd Hep B vaccine. We saw she had a recent visit to urgent care with a fever. She reports ____. She has been on ____ and showed interest in Apretude. After discussion of the risks and benefits, she _____.  Plan: - Collect _____ today - Administer the Hep B vaccine today - Follow up on ____ with Cassie  Jacolyn Reedy, Student Pharm[facility]

## 2022-06-06 ENCOUNTER — Other Ambulatory Visit (HOSPITAL_COMMUNITY)
Admission: RE | Admit: 2022-06-06 | Discharge: 2022-06-06 | Disposition: A | Discharge status: Home / Self Care | Payer: BC Managed Care – PPO | Source: Ambulatory Visit | Attending: Infectious Diseases | Admitting: Infectious Diseases

## 2022-06-06 ENCOUNTER — Other Ambulatory Visit: Payer: Self-pay

## 2022-06-06 ENCOUNTER — Ambulatory Visit (INDEPENDENT_AMBULATORY_CARE_PROVIDER_SITE_OTHER): Payer: BC Managed Care – PPO | Admitting: Pharmacist

## 2022-06-06 DIAGNOSIS — Z23 Encounter for immunization: Secondary | ICD-10-CM

## 2022-06-06 DIAGNOSIS — Z79899 Other long term (current) drug therapy: Secondary | ICD-10-CM | POA: Diagnosis not present

## 2022-06-06 DIAGNOSIS — Z113 Encounter for screening for infections with a predominantly sexual mode of transmission: Secondary | ICD-10-CM | POA: Diagnosis present

## 2022-06-06 MED ORDER — CABOTEGRAVIR ER 600 MG/3ML IM SUER
600.0000 mg | Freq: Once | INTRAMUSCULAR | Status: AC
  Administered 2022-06-06: 600 mg via INTRAMUSCULAR

## 2022-06-07 LAB — URINE CYTOLOGY ANCILLARY ONLY
Chlamydia: NEGATIVE
Comment: NEGATIVE
Comment: NEGATIVE
Comment: NORMAL
Neisseria Gonorrhea: NEGATIVE
Trichomonas: NEGATIVE

## 2022-06-07 LAB — CYTOLOGY, (ORAL, ANAL, URETHRAL) ANCILLARY ONLY
Chlamydia: NEGATIVE
Comment: NEGATIVE
Comment: NORMAL
Neisseria Gonorrhea: NEGATIVE

## 2022-06-08 LAB — HIV-1 RNA QUANT-NO REFLEX-BLD
HIV 1 RNA Quant: NOT DETECTED Copies/mL
HIV-1 RNA Quant, Log: NOT DETECTED Log cps/mL

## 2022-06-08 LAB — RPR: RPR Ser Ql: NONREACTIVE

## 2022-07-11 ENCOUNTER — Ambulatory Visit (INDEPENDENT_AMBULATORY_CARE_PROVIDER_SITE_OTHER): Payer: BC Managed Care – PPO | Admitting: Pharmacist

## 2022-07-11 ENCOUNTER — Other Ambulatory Visit (HOSPITAL_COMMUNITY)
Admission: RE | Admit: 2022-07-11 | Discharge: 2022-07-11 | Disposition: A | Discharge status: Home / Self Care | Payer: BC Managed Care – PPO | Source: Ambulatory Visit | Attending: Infectious Diseases | Admitting: Infectious Diseases

## 2022-07-11 ENCOUNTER — Other Ambulatory Visit: Payer: Self-pay

## 2022-07-11 DIAGNOSIS — Z2981 Encounter for HIV pre-exposure prophylaxis: Secondary | ICD-10-CM | POA: Diagnosis not present

## 2022-07-11 DIAGNOSIS — Z79899 Other long term (current) drug therapy: Secondary | ICD-10-CM

## 2022-07-11 DIAGNOSIS — Z113 Encounter for screening for infections with a predominantly sexual mode of transmission: Secondary | ICD-10-CM | POA: Diagnosis present

## 2022-07-11 MED ORDER — CABOTEGRAVIR ER 600 MG/3ML IM SUER
600.0000 mg | Freq: Once | INTRAMUSCULAR | Status: AC
  Administered 2022-07-11: 600 mg via INTRAMUSCULAR

## 2022-07-11 NOTE — Progress Notes (Signed)
HPI: Christy Thomas is a 27 y.o. female who presents to the [facility] clinic for Apretude administration and HIV PrEP follow up.  Patient Active Problem List   Diagnosis Date Noted   Acute cystitis with hematuria 01/25/2022   Dizziness 01/13/2022   Infection due to Neisseria gonorrhoeae 11/27/2021   Recurrent tonsillitis 11/23/2021   Acute pharyngitis 11/23/2021   Anxiety 11/23/2021   Bipolar 2 disorder, major depressive episode ([facility]) 11/23/2021   Panic attacks 11/23/2021   Lymphadenopathy 11/23/2021   Anemia 11/23/2021   ESBL (extended spectrum beta-lactamase) producing bacteria infection 10/18/2021   PICC (peripherally inserted central catheter) in place 10/18/2021   Normocytic anemia 09/30/2021   Acute pyelonephritis 09/28/2021   Hypokalemia 09/28/2021   Urinary tract infection due to extended-spectrum beta lactamase (ESBL) producing Escherichia coli 09/28/2021    Patient's Medications  New Prescriptions   No medications on file  Previous Medications   IBUPROFEN (ADVIL) 200 MG TABLET    Take 400 mg by mouth every 6 (six) hours as needed for headache (pain).   LIDOCAINE (XYLOCAINE) 2 % SOLUTION    Use as directed 15 mLs in the mouth or throat as needed for mouth pain.   MULTIPLE VITAMIN (MULTIVITAMIN WITH MINERALS) TABS TABLET    Take 1 tablet by mouth every morning.   NORETHINDRONE-ETHINYL ESTRADIOL-FE (LOESTRIN FE) 1-20 MG-MCG TABLET    Take 1 tablet by mouth every morning.   UNKNOWN TO PATIENT    ?Nitrofurantoin for UTI   VALACYCLOVIR (VALTREX) 500 MG TABLET    Take 500 mg by mouth every morning.  Modified Medications   No medications on file  Discontinued Medications   No medications on file    Allergies: No Known Allergies  Past Medical History: Past Medical History:  Diagnosis Date   Gonorrhea 11/27/2021   UTI (urinary tract infection)     Social History: Social History   Socioeconomic History   Marital status: Single    Spouse name: Not on file    Number of children: 0   Years of education: Not on file   Highest education level: Not on file  Occupational History   Not on file  Tobacco Use   Smoking status: Never   Smokeless tobacco: Never  Vaping Use   Vaping Use: Never used  Substance and Sexual Activity   Alcohol use: Not Currently    Comment: occasionally   Drug use: Not Currently    Types: Marijuana   Sexual activity: Not on file  Other Topics Concern   Not on file  Social History Narrative   Not on file   Social Determinants of Health   Financial Resource Strain: Not on file  Food Insecurity: Not on file  Transportation Needs: Not on file  Physical Activity: Not on file  Stress: Not on file  Social Connections: Not on file    Labs: Lab Results  Component Value Date   HIV1RNAQUANT Not Detected 06/06/2022   HIV1RNAQUANT Not Detected 01/27/2022    RPR and STI Lab Results  Component Value Date   LABRPR NON-REACTIVE 06/06/2022   LABRPR NON-REACTIVE 05/11/2022   LABRPR NON-REACTIVE 02/24/2022   LABRPR NON-REACTIVE 11/23/2021    STI Results GC CT  06/06/2022  2:06 PM Negative    Negative  Negative    Negative   05/11/2022  4:36 PM  Negative   05/11/2022  4:29 PM  Negative   02/24/2022  9:55 AM Negative  Negative   01/25/2022  1:50 PM  Negative  Negative   01/06/2022 12:37 PM Positive  Negative   12/06/2021  1:55 PM  Negative   11/23/2021 10:51 AM  Negative   09/25/2021 11:19 AM Negative  Negative   08/26/2021  1:37 PM Negative  Negative   10/29/2020  6:26 PM Negative  Negative     Hepatitis B Lab Results  Component Value Date   HEPBSAB NON-REACTIVE 02/24/2022   HEPBSAG NON-REACTIVE 01/27/2022   Hepatitis C Lab Results  Component Value Date   HEPCAB NON-REACTIVE 02/24/2022   Hepatitis A Lab Results  Component Value Date   HAV NON-REACTIVE 01/27/2022   Lipids: No results found for: "CHOL", "TRIG", "HDL", "CHOLHDL", "VLDL", "LDLCALC"  TARGET DATE: 28th of each  month  Assessment: Chari presents today for her Apretude injection and to follow up for HIV PrEP. No issues with past injections. Screened for acute HIV symptoms such as fatigue, muscle aches, rash, sore throat, lymphadenopathy, headache, night sweats, nausea/vomiting/diarrhea, and fever. Denies any symptoms. No known exposures to any STIs and no signs or symptoms of any STIs today. She does wish to have STI screening conducted this visit.   Per [employer] guidelines, a rapid HIV test should be drawn prior to Apretude administration. Due to state shortage of rapid HIV tests, this is temporarily unable to be done. Per decision from [place], we will proceed with Apretude administration at this time without a negative rapid HIV test beforehand. HIV RNA was collected today and is in process.  Will also plan on collecting a hepatitis B surface antibody to assess for immunity given second dose of Heplisav was administered 06/06/22.   Administered cabotegravir 600mg /6mL in left upper outer quadrant of the gluteal muscle. Injection was tolerated well without issue. Will see her back in 2 months for injection, labs, and HIV PrEP follow up.  Plan: - Apretude injection administered - HIV RNA today - F/U HepB surface antibody  - F/U urine and oral cytologies, RPR - Next injection, labs, and PrEP follow up appointment scheduled for 09/01/22 with Estill Bamberg - HPV #3 at next visit  - Call with any issues or questions  Adria Dill, PharmD PGY-2 Infectious Diseases Resident  07/11/2022 1:38 PM

## 2022-07-12 ENCOUNTER — Telehealth: Payer: Self-pay | Admitting: Pharmacist

## 2022-07-12 ENCOUNTER — Other Ambulatory Visit: Payer: Self-pay | Admitting: Pharmacist

## 2022-07-12 DIAGNOSIS — A749 Chlamydial infection, unspecified: Secondary | ICD-10-CM

## 2022-07-12 LAB — CYTOLOGY, (ORAL, ANAL, URETHRAL) ANCILLARY ONLY
Chlamydia: NEGATIVE
Comment: NEGATIVE
Comment: NORMAL
Neisseria Gonorrhea: NEGATIVE

## 2022-07-12 LAB — URINE CYTOLOGY ANCILLARY ONLY
Chlamydia: POSITIVE — AB
Comment: NEGATIVE
Comment: NEGATIVE
Comment: NORMAL
Neisseria Gonorrhea: NEGATIVE
Trichomonas: NEGATIVE

## 2022-07-12 MED ORDER — DOXYCYCLINE HYCLATE 100 MG PO TABS: 100.0000 mg | ORAL_TABLET | Freq: Two times a day (BID) | ORAL | 0 refills | Status: AC

## 2022-07-12 NOTE — Telephone Encounter (Signed)
Patient's urine screen is positive for chlamydia. Called patient to discuss. She has no allergies to antibiotics. Will send in doxycycline 100 mg PO BID x 7 days to [facility]. Advised her to take all of the prescription and not miss any doses. Also advised to take with a full glass of water and with food to avoid nausea. Asked to abstain from any sexual activity until 7 days post treatment. She verbalized understanding.  Katlyne Nishida L. Rhilyn Battle, PharmD, BCIDP, AAHIVP, CPP Clinical Pharmacist Practitioner [facility] 07/12/2022, 4:45 PM

## 2022-07-13 LAB — HIV-1 RNA QUANT-NO REFLEX-BLD
HIV 1 RNA Quant: NOT DETECTED Copies/mL
HIV-1 RNA Quant, Log: NOT DETECTED Log cps/mL

## 2022-07-13 LAB — HEPATITIS B SURFACE ANTIBODY,QUALITATIVE: Hep B S Ab: REACTIVE — AB

## 2022-07-13 LAB — RPR: RPR Ser Ql: NONREACTIVE

## 2022-08-04 ENCOUNTER — Ambulatory Visit: Payer: BC Managed Care – PPO | Admitting: Family Medicine

## 2022-08-04 ENCOUNTER — Encounter: Payer: Self-pay | Admitting: Family Medicine

## 2022-08-04 VITALS — BP 92/60 | HR 82 | Temp 97.6°F | Ht 62.0 in | Wt 137.0 lb

## 2022-08-04 DIAGNOSIS — A749 Chlamydial infection, unspecified: Secondary | ICD-10-CM

## 2022-08-04 DIAGNOSIS — J0391 Acute recurrent tonsillitis, unspecified: Secondary | ICD-10-CM | POA: Diagnosis not present

## 2022-08-04 DIAGNOSIS — J302 Other seasonal allergic rhinitis: Secondary | ICD-10-CM

## 2022-08-04 NOTE — Addendum Note (Signed)
Addended by: Rossie Muskrat on: 08/04/2022 02:05 PM   Modules accepted: Orders

## 2022-08-04 NOTE — Patient Instructions (Signed)
Try over-the-counter Xyzal for allergies.  Let me know if this is not helpful.  We can consider a referral to an allergist if you would like to pursue this.  I placed a referral to ENT. Let me know if you do not hear from them in the next 2 to 3 weeks.  We will be in touch with your results from today.

## 2022-08-04 NOTE — Progress Notes (Signed)
Subjective:     Patient ID: Christy Thomas, female    DOB: 1996-05-13, 27 y.o.   MRN: [id number]  Chief Complaint  Patient presents with   std testing    Wants to make sure everything is ok, no symptoms. Has some general quesitons    HPI Patient is in today for multiple issues.   Recent bad reaction to alcohol and cocaine. Questions whether we could tell what happened.   Allergies- winter time is worse - is not taking anything for this.   Requests new referral to ENT for recurrent tonsillitis. States she did not see them in May with last referral.   States she recently tested positive for chlamydia by doing a urine test at [facility]. She took the treatment. Would like retesting for this.   Denies any symptoms including vaginal discharge.     Health Maintenance Due  Topic Date Due   HPV VACCINES (3 - 3-dose series) 07/30/2022    Past Medical History:  Diagnosis Date   Gonorrhea 11/27/2021   UTI (urinary tract infection)     Past Surgical History:  Procedure Laterality Date   NO PAST SURGERIES      Family History  Problem Relation Age of Onset   Lymphoma Mother     Social History   Socioeconomic History   Marital status: Single    Spouse name: Not on file   Number of children: 0   Years of education: Not on file   Highest education level: Not on file  Occupational History   Not on file  Tobacco Use   Smoking status: Never   Smokeless tobacco: Never  Vaping Use   Vaping Use: Never used  Substance and Sexual Activity   Alcohol use: Not Currently    Comment: occasionally   Drug use: Not Currently    Types: Marijuana   Sexual activity: Not on file  Other Topics Concern   Not on file  Social History Narrative   Not on file   Social Determinants of Health   Financial Resource Strain: Not on file  Food Insecurity: Not on file  Transportation Needs: Not on file  Physical Activity: Not on file  Stress: Not on file  Social Connections: Not on file   Intimate Partner Violence: Not on file    Outpatient Medications Prior to Visit  Medication Sig Dispense Refill   ibuprofen (ADVIL) 200 MG tablet Take 400 mg by mouth every 6 (six) hours as needed for headache (pain).     Multiple Vitamin (MULTIVITAMIN WITH MINERALS) TABS tablet Take 1 tablet by mouth every morning.     norethindrone-ethinyl estradiol-FE (LOESTRIN FE) 1-20 MG-MCG tablet Take 1 tablet by mouth every morning.     valACYclovir (VALTREX) 500 MG tablet Take 500 mg by mouth every morning.     lidocaine (XYLOCAINE) 2 % solution Use as directed 15 mLs in the mouth or throat as needed for mouth pain. (Patient not taking: Reported on 08/04/2022) 100 mL 0   UNKNOWN TO PATIENT ?Nitrofurantoin for UTI (Patient not taking: Reported on 08/04/2022)     No facility-administered medications prior to visit.    No Known Allergies  ROS     Objective:    Physical Exam Constitutional:      General: She is not in acute distress.    Appearance: She is not ill-appearing.  Cardiovascular:     Rate and Rhythm: Normal rate.  Pulmonary:     Effort: Pulmonary effort is normal.  Skin:  General: Skin is dry.  Neurological:     General: No focal deficit present.     Mental Status: She is alert and oriented to person, place, and time.     Gait: Gait normal.  Psychiatric:        Mood and Affect: Mood normal.        Behavior: Behavior normal.        Thought Content: Thought content normal.     BP 92/60 (BP Location: Left Arm, Patient Position: Sitting, Cuff Size: Large)   Pulse 82   Temp 97.6 F (36.4 C) (Temporal)   Ht 5\' 2"  (1.575 m)   Wt 137 lb (62.1 kg)   SpO2 98%   BMI 25.06 kg/m  Wt Readings from Last 3 Encounters:  08/04/22 137 lb (62.1 kg)  05/11/22 136 lb (61.7 kg)  02/24/22 136 lb (61.7 kg)        Assessment & Plan:   Problem List Items Addressed This Visit       Respiratory   Recurrent tonsillitis - Primary   Relevant Orders   Ambulatory referral to ENT    Other Visit Diagnoses     Seasonal allergies       Chlamydia infection       Relevant Orders   GC/Chlamydia Probe Amp      Reviewed chart including STD testing earlier this month with positive chlamydia. She was treated and we will retest to ensure resolution.  Asymptomatic.  New referral placed to ENT for recurrent tonsillitis.  Try Xyzal for allergies. Follow up if not improving.  Advised to avoid cocaine or other potentially harmful substances.  Follow up prn.  I have discontinued Christy Thomas's UNKNOWN TO PATIENT and lidocaine. I am also having her maintain her ibuprofen, norethindrone-ethinyl estradiol-FE, valACYclovir, and multivitamin with minerals.  No orders of the defined types were placed in this encounter.

## 2022-08-08 LAB — GC/CHLAMYDIA PROBE AMP
Chlamydia trachomatis, NAA: NEGATIVE
Neisseria Gonorrhoeae by PCR: NEGATIVE

## 2022-08-24 ENCOUNTER — Encounter: Payer: Self-pay | Admitting: Family

## 2022-08-24 ENCOUNTER — Telehealth (INDEPENDENT_AMBULATORY_CARE_PROVIDER_SITE_OTHER): Payer: BC Managed Care – PPO | Admitting: Family

## 2022-08-24 VITALS — Ht 62.0 in | Wt 135.0 lb

## 2022-08-24 DIAGNOSIS — J028 Acute pharyngitis due to other specified organisms: Secondary | ICD-10-CM

## 2022-08-24 DIAGNOSIS — B9789 Other viral agents as the cause of diseases classified elsewhere: Secondary | ICD-10-CM

## 2022-08-24 NOTE — Progress Notes (Signed)
MyChart Video Visit    Virtual Visit via Video Note   This format is felt to be most appropriate for this patient at this time. Physical exam was limited by quality of the video and audio technology used for the visit. CMA was able to get the patient set up on a video visit.  Patient location: Home. Patient and provider in visit Provider location: Office  I discussed the limitations of evaluation and management by telemedicine and the availability of in person appointments. The patient expressed understanding and agreed to proceed.  Visit Date: 08/24/2022  Today's healthcare provider: Jeanie Sewer, NP     Subjective:   Patient ID: Christy Thomas, female    DOB: 08/05/1995, 27 y.o.   MRN: [id number]  Chief Complaint  Patient presents with   Sore Throat    sx 2d    HPI Sore throat:  Pt c/o sore throat for 2 days, along with tonsil stones. Has tried ibuprofen which does reduce swelling so she can eat and talk. Denies any fever or having Strep throat.    Assessment & Plan:  1. Sore throat (viral) - advised pt to increase Ibuprofen to 623m tid prn for pain & swelling, take after eating. Do warm salt water gargles tid prn. Can also look for Peroxyl oral rinse to help with pain & promote healing. Advised pt to come in for a throat swab if pain is not improved by Monday.   Past Medical History:  Diagnosis Date   Gonorrhea 11/27/2021   UTI (urinary tract infection)     Past Surgical History:  Procedure Laterality Date   NO PAST SURGERIES      Outpatient Medications Prior to Visit  Medication Sig Dispense Refill   ibuprofen (ADVIL) 200 MG tablet Take 400 mg by mouth every 6 (six) hours as needed for headache (pain).     levonorgestrel (KYLEENA) 19.5 MG IUD Provided by care center     Multiple Vitamin (MULTIVITAMIN WITH MINERALS) TABS tablet Take 1 tablet by mouth every morning.     valACYclovir (VALTREX) 500 MG tablet Take 500 mg by mouth every morning.      norethindrone-ethinyl estradiol-FE (LOESTRIN FE) 1-20 MG-MCG tablet Take 1 tablet by mouth every morning.     No facility-administered medications prior to visit.    No Known Allergies     Objective:   Physical Exam Vitals and nursing note reviewed.  Constitutional:      General: Pt is not in acute distress.    Appearance: Normal appearance.  HENT:     Head: Normocephalic.  Pulmonary:     Effort: No respiratory distress.  Musculoskeletal:     Cervical back: Normal range of motion.  Skin:    General: Skin is dry.     Coloration: Skin is not pale.  Neurological:     Mental Status: Pt is alert and oriented to person, place, and time.  Psychiatric:        Mood and Affect: Mood normal.   Ht 5' 2"$  (1.575 m)   Wt 135 lb (61.2 kg) Comment: pt reported  BMI 24.69 kg/m   Wt Readings from Last 3 Encounters:  08/24/22 135 lb (61.2 kg)  08/04/22 137 lb (62.1 kg)  05/11/22 136 lb (61.7 kg)    I discussed the assessment and treatment plan with the patient. The patient was provided an opportunity to ask questions and all were answered. The patient agreed with the plan and demonstrated an understanding of the  instructions.   The patient was advised to call back or seek an in-person evaluation if the symptoms worsen or if the condition fails to improve as anticipated.  Jeanie Sewer, NP [place] [phone] (phone) [phone] (fax)  [place]

## 2022-09-01 ENCOUNTER — Ambulatory Visit (INDEPENDENT_AMBULATORY_CARE_PROVIDER_SITE_OTHER): Payer: BC Managed Care – PPO | Admitting: Pharmacist

## 2022-09-01 ENCOUNTER — Other Ambulatory Visit: Payer: Self-pay

## 2022-09-01 DIAGNOSIS — Z79899 Other long term (current) drug therapy: Secondary | ICD-10-CM

## 2022-09-01 DIAGNOSIS — Z113 Encounter for screening for infections with a predominantly sexual mode of transmission: Secondary | ICD-10-CM

## 2022-09-01 DIAGNOSIS — Z2981 Encounter for HIV pre-exposure prophylaxis: Secondary | ICD-10-CM | POA: Diagnosis not present

## 2022-09-01 MED ORDER — CABOTEGRAVIR ER 600 MG/3ML IM SUER
600.0000 mg | Freq: Once | INTRAMUSCULAR | Status: AC
  Administered 2022-09-01: 600 mg via INTRAMUSCULAR

## 2022-09-01 NOTE — Progress Notes (Deleted)
HPI: Christy Thomas is a 27 y.o. female who presents to the [facility] clinic for Apretude administration and HIV PrEP follow up.  Patient Active Problem List   Diagnosis Date Noted   Acute cystitis with hematuria 01/25/2022   Dizziness 01/13/2022   Infection due to Neisseria gonorrhoeae 11/27/2021   Recurrent tonsillitis 11/23/2021   Acute pharyngitis 11/23/2021   Anxiety 11/23/2021   Bipolar 2 disorder, major depressive episode ([place]) 11/23/2021   Panic attacks 11/23/2021   Lymphadenopathy 11/23/2021   Anemia 11/23/2021   ESBL (extended spectrum beta-lactamase) producing bacteria infection 10/18/2021   PICC (peripherally inserted central catheter) in place 10/18/2021   Normocytic anemia 09/30/2021   Acute pyelonephritis 09/28/2021   Hypokalemia 09/28/2021   Urinary tract infection due to extended-spectrum beta lactamase (ESBL) producing Escherichia coli 09/28/2021    Patient's Medications  New Prescriptions   No medications on file  Previous Medications   IBUPROFEN (ADVIL) 200 MG TABLET    Take 400 mg by mouth every 6 (six) hours as needed for headache (pain).   LEVONORGESTREL (KYLEENA) 19.5 MG IUD    Provided by care center   MULTIPLE VITAMIN (MULTIVITAMIN WITH MINERALS) TABS TABLET    Take 1 tablet by mouth every morning.   VALACYCLOVIR (VALTREX) 500 MG TABLET    Take 500 mg by mouth every morning.  Modified Medications   No medications on file  Discontinued Medications   No medications on file    Allergies: No Known Allergies  Past Medical History: Past Medical History:  Diagnosis Date   Gonorrhea 11/27/2021   UTI (urinary tract infection)     Social History: Social History   Socioeconomic History   Marital status: Single    Spouse name: Not on file   Number of children: 0   Years of education: Not on file   Highest education level: Not on file  Occupational History   Not on file  Tobacco Use   Smoking status: Never   Smokeless tobacco: Never   Vaping Use   Vaping Use: Never used  Substance and Sexual Activity   Alcohol use: Not Currently    Comment: occasionally   Drug use: Not Currently    Types: Marijuana   Sexual activity: Not on file  Other Topics Concern   Not on file  Social History Narrative   Not on file   Social Determinants of Health   Financial Resource Strain: Not on file  Food Insecurity: Not on file  Transportation Needs: Not on file  Physical Activity: Not on file  Stress: Not on file  Social Connections: Not on file    Labs: Lab Results  Component Value Date   HIV1RNAQUANT Not Detected 07/11/2022   HIV1RNAQUANT Not Detected 06/06/2022   HIV1RNAQUANT Not Detected 01/27/2022    RPR and STI Lab Results  Component Value Date   LABRPR NON-REACTIVE 07/11/2022   LABRPR NON-REACTIVE 06/06/2022   LABRPR NON-REACTIVE 05/11/2022   LABRPR NON-REACTIVE 02/24/2022   LABRPR NON-REACTIVE 11/23/2021    STI Results GC CT  Latest Ref Rng & Units  Negative  08/04/2022  2:07 PM  Negative   07/11/2022  1:54 PM Negative    Negative  Negative    Positive   06/06/2022  2:06 PM Negative    Negative  Negative    Negative   05/11/2022  4:36 PM  Negative   05/11/2022  4:29 PM  Negative   02/24/2022  9:55 AM Negative  Negative   01/25/2022  1:50 PM  Negative    Negative   01/06/2022 12:37 PM Positive  Negative   12/06/2021  1:55 PM  Negative   11/23/2021 10:51 AM  Negative   09/25/2021 11:19 AM Negative  Negative   08/26/2021  1:37 PM Negative  Negative   10/29/2020  6:26 PM Negative  Negative     Hepatitis B Lab Results  Component Value Date   HEPBSAB REACTIVE (A) 07/11/2022   HEPBSAG NON-REACTIVE 01/27/2022   Hepatitis C Lab Results  Component Value Date   HEPCAB NON-REACTIVE 02/24/2022   Hepatitis A Lab Results  Component Value Date   HAV NON-REACTIVE 01/27/2022   Lipids: No results found for: "CHOL", "TRIG", "HDL", "CHOLHDL", "VLDL", "LDLCALC"  TARGET DATE: ***  Assessment: ***  presents today for *** Apretude injection and to follow up for HIV PrEP. No issues with past injections. Screened for acute HIV symptoms such as fatigue, muscle aches, rash, sore throat, lymphadenopathy, headache, night sweats, nausea/vomiting/diarrhea, and fever. Denies any symptoms. No known exposures to any STIs since last visit. ***Agrees to full STI testing today with RPR and oral/urine/rectal cytologies.   Per [employer] guidelines, a rapid HIV test should be drawn prior to Apretude administration. Due to state shortage of rapid HIV tests, this is temporarily unable to be done. Per decision from [place], we will proceed with Apretude administration at this time without a negative rapid HIV test beforehand. HIV RNA was collected today and is in process.  Administered cabotegravir '600mg'$ /91m in *** upper outer quadrant of the gluteal muscle. Will see *** back in 2 months for injection, labs, and HIV PrEP follow up.  Plan: - Apretude injection administered - HIV RNA today - Next injection, labs, and PrEP follow up appointment scheduled for *** - Call with any issues or questions  KTitus Dubin PharmD PGY1 Pharmacy Resident 09/01/2022 11:45 AM

## 2022-09-01 NOTE — Progress Notes (Unsigned)
HPI: Christy Thomas is a 27 y.o. female who presents to the [facility] clinic for Apretude administration and HIV PrEP follow up.  Patient Active Problem List   Diagnosis Date Noted   Acute cystitis with hematuria 01/25/2022   Dizziness 01/13/2022   Infection due to Neisseria gonorrhoeae 11/27/2021   Recurrent tonsillitis 11/23/2021   Acute pharyngitis 11/23/2021   Anxiety 11/23/2021   Bipolar 2 disorder, major depressive episode ([place]) 11/23/2021   Panic attacks 11/23/2021   Lymphadenopathy 11/23/2021   Anemia 11/23/2021   ESBL (extended spectrum beta-lactamase) producing bacteria infection 10/18/2021   PICC (peripherally inserted central catheter) in place 10/18/2021   Normocytic anemia 09/30/2021   Acute pyelonephritis 09/28/2021   Hypokalemia 09/28/2021   Urinary tract infection due to extended-spectrum beta lactamase (ESBL) producing Escherichia coli 09/28/2021    Patient's Medications  New Prescriptions   No medications on file  Previous Medications   IBUPROFEN (ADVIL) 200 MG TABLET    Take 400 mg by mouth every 6 (six) hours as needed for headache (pain).   LEVONORGESTREL (KYLEENA) 19.5 MG IUD    Provided by care center   MULTIPLE VITAMIN (MULTIVITAMIN WITH MINERALS) TABS TABLET    Take 1 tablet by mouth every morning.   VALACYCLOVIR (VALTREX) 500 MG TABLET    Take 500 mg by mouth every morning.  Modified Medications   No medications on file  Discontinued Medications   No medications on file    Allergies: No Known Allergies  Past Medical History: Past Medical History:  Diagnosis Date   Gonorrhea 11/27/2021   UTI (urinary tract infection)     Social History: Social History   Socioeconomic History   Marital status: Single    Spouse name: Not on file   Number of children: 0   Years of education: Not on file   Highest education level: Not on file  Occupational History   Not on file  Tobacco Use   Smoking status: Never   Smokeless tobacco: Never   Vaping Use   Vaping Use: Never used  Substance and Sexual Activity   Alcohol use: Not Currently    Comment: occasionally   Drug use: Not Currently    Types: Marijuana   Sexual activity: Not on file  Other Topics Concern   Not on file  Social History Narrative   Not on file   Social Determinants of Health   Financial Resource Strain: Not on file  Food Insecurity: Not on file  Transportation Needs: Not on file  Physical Activity: Not on file  Stress: Not on file  Social Connections: Not on file    Labs: Lab Results  Component Value Date   HIV1RNAQUANT Not Detected 07/11/2022   HIV1RNAQUANT Not Detected 06/06/2022   HIV1RNAQUANT Not Detected 01/27/2022    RPR and STI Lab Results  Component Value Date   LABRPR NON-REACTIVE 07/11/2022   LABRPR NON-REACTIVE 06/06/2022   LABRPR NON-REACTIVE 05/11/2022   LABRPR NON-REACTIVE 02/24/2022   LABRPR NON-REACTIVE 11/23/2021    STI Results GC CT  Latest Ref Rng & Units  Negative  08/04/2022  2:07 PM  Negative   07/11/2022  1:54 PM Negative    Negative  Negative    Positive   06/06/2022  2:06 PM Negative    Negative  Negative    Negative   05/11/2022  4:36 PM  Negative   05/11/2022  4:29 PM  Negative   02/24/2022  9:55 AM Negative  Negative   01/25/2022  1:50 PM  Negative    Negative   01/06/2022 12:37 PM Positive  Negative   12/06/2021  1:55 PM  Negative   11/23/2021 10:51 AM  Negative   09/25/2021 11:19 AM Negative  Negative   08/26/2021  1:37 PM Negative  Negative   10/29/2020  6:26 PM Negative  Negative     Hepatitis B Lab Results  Component Value Date   HEPBSAB REACTIVE (A) 07/11/2022   HEPBSAG NON-REACTIVE 01/27/2022   Hepatitis C Lab Results  Component Value Date   HEPCAB NON-REACTIVE 02/24/2022   Hepatitis A Lab Results  Component Value Date   HAV NON-REACTIVE 01/27/2022   Lipids: No results found for: "CHOL", "TRIG", "HDL", "CHOLHDL", "VLDL", "LDLCALC"  TARGET DATE: The 28th of the  month  Current PrEP Regimen: Apretude  Assessment: Christy Thomas presents today for their Apretude injection and to follow up for HIV PrEP. No issues with past injections. No known exposures to any STIs and no signs or symptoms of any STIs today. Screened patient for acute HIV symptoms such as fatigue, muscle aches, rash, sore throat, lymphadenopathy, headache, night sweats, nausea/vomiting/diarrhea, and fever. Patient denies any symptoms. Requests urine and oral swabs today. Requests monthly STI testing visits in between Apretude visits.  Per [employer] guidelines, a rapid HIV test should be drawn prior to Apretude administration. Due to state shortage of rapid HIV tests, this is temporarily unable to be done. Per decision from [place], we will proceed with Apretude administration at this time without a negative rapid HIV test beforehand. HIV RNA was collected today and is in process.  Administered cabotegravir '600mg'$ /43m in left upper outer quadrant of the gluteal muscle. Will make follow up appointments for maintenance injections every 2 months.   Plan: - Maintenance injections scheduled for 4/26 with me - STI testing on 3/22 with me  - Check HIV RNA and urine/oral cytologies  - Call with any issues or questions  AAlfonse Spruce PharmD, CPP, BCIDP, AClementonClinical Pharmacist Practitioner Infectious DMarshallfor Infectious Disease

## 2022-09-02 LAB — C. TRACHOMATIS/N. GONORRHOEAE RNA
C. trachomatis RNA, TMA: NOT DETECTED
N. gonorrhoeae RNA, TMA: NOT DETECTED

## 2022-09-02 LAB — GC/CHLAMYDIA PROBE, AMP (THROAT)
Chlamydia trachomatis RNA: NOT DETECTED
Neisseria gonorrhoeae RNA: NOT DETECTED

## 2022-09-03 ENCOUNTER — Other Ambulatory Visit: Payer: Self-pay

## 2022-09-03 ENCOUNTER — Ambulatory Visit (HOSPITAL_COMMUNITY)
Admission: EM | Admit: 2022-09-03 | Discharge: 2022-09-03 | Disposition: A | Discharge status: Home / Self Care | Payer: No Typology Code available for payment source | Source: Ambulatory Visit | Attending: Emergency Medicine | Admitting: Emergency Medicine

## 2022-09-03 ENCOUNTER — Emergency Department (HOSPITAL_BASED_OUTPATIENT_CLINIC_OR_DEPARTMENT_OTHER)
Admission: EM | Admit: 2022-09-03 | Discharge: 2022-09-03 | Disposition: A | Discharge status: Home / Self Care | Payer: BC Managed Care – PPO | Attending: Emergency Medicine | Admitting: Emergency Medicine

## 2022-09-03 DIAGNOSIS — T7621XA Adult sexual abuse, suspected, initial encounter: Secondary | ICD-10-CM | POA: Insufficient documentation

## 2022-09-03 DIAGNOSIS — Z0441 Encounter for examination and observation following alleged adult rape: Secondary | ICD-10-CM | POA: Diagnosis present

## 2022-09-03 LAB — COMPREHENSIVE METABOLIC PANEL
ALT: 14 U/L (ref 0–44)
AST: 15 U/L (ref 15–41)
Albumin: 4.2 g/dL (ref 3.5–5.0)
Alkaline Phosphatase: 66 U/L (ref 38–126)
Anion gap: 13 (ref 5–15)
BUN: 7 mg/dL (ref 6–20)
CO2: 22 mmol/L (ref 22–32)
Calcium: 9.8 mg/dL (ref 8.9–10.3)
Chloride: 103 mmol/L (ref 98–111)
Creatinine, Ser: 0.78 mg/dL (ref 0.44–1.00)
GFR, Estimated: >60 mL/min (ref >60)
Glucose, Bld: 90 mg/dL (ref 70–99)
Potassium: 3.4 mmol/L — ABNORMAL LOW (ref 3.5–5.1)
Sodium: 138 mmol/L (ref 135–145)
Total Bilirubin: 0.3 mg/dL (ref 0.3–1.2)
Total Protein: 8.3 g/dL — ABNORMAL HIGH (ref 6.5–8.1)

## 2022-09-03 LAB — HEPATITIS B SURFACE ANTIGEN: Hepatitis B Surface Ag: NONREACTIVE

## 2022-09-03 LAB — HIV-1 RNA QUANT-NO REFLEX-BLD
HIV 1 RNA Quant: NOT DETECTED Copies/mL
HIV-1 RNA Quant, Log: NOT DETECTED Log cps/mL

## 2022-09-03 LAB — HEPATITIS C ANTIBODY: HCV Ab: NONREACTIVE

## 2022-09-03 LAB — PREGNANCY, URINE: Preg Test, Ur: NEGATIVE

## 2022-09-03 LAB — RAPID HIV SCREEN (HIV 1/2 AB+AG)
HIV 1/2 Antibodies: NONREACTIVE
HIV-1 P24 Antigen - HIV24: NONREACTIVE
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
## 2022-09-03 NOTE — ED Notes (Signed)
Patient updated on plan of care. Patient resting quietly in stretcher, respirations even, unlabored, no acute distress noted. Denies needs at this time. Visitor at bedside.
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
## 2022-09-03 NOTE — ED Notes (Signed)
SANE arrived and speaking with pt at this time.
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
## 2022-09-03 NOTE — Consult Note (Signed)
Primary care physician, Chrissie Noa, [place] Therapist, Annalise, A path to wellness   HIV Prep- shot every two months- [facility]. Also keep up to date on all vaccines.   Tested once a month for all infections, last test was 09/01/2022, all results were negative  The patient graduates from college at the end of the summer and is going back to [place], where her family lives.
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
## 2022-09-04 ENCOUNTER — Telehealth: Payer: Self-pay

## 2022-09-04 LAB — RPR: RPR Ser Ql: NONREACTIVE

## 2022-09-04 NOTE — ED Notes (Signed)
The SANE/FNE Naval architect) consult has been completed. The primary RN and/or provider have been notified. Please contact the SANE/FNE nurse on call (listed in [place]) with any further concerns.   Discussed patient status, pending discharge and plan for follow up care with Dr, Nanda Quinton and ED RN, Sam, All parties in agreement with plan of care.
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
## 2022-09-29 ENCOUNTER — Other Ambulatory Visit: Payer: Self-pay

## 2022-09-29 ENCOUNTER — Ambulatory Visit (INDEPENDENT_AMBULATORY_CARE_PROVIDER_SITE_OTHER): Payer: BC Managed Care – PPO | Admitting: Pharmacist

## 2022-09-29 DIAGNOSIS — Z113 Encounter for screening for infections with a predominantly sexual mode of transmission: Secondary | ICD-10-CM

## 2022-09-29 NOTE — [place] Notes (Signed)
   [facility] Infectious Disease Pharmacy STI Visit  HPI: Christy Thomas is a 27 y.o. female who presents to the [facility] clinic for STI testing.  Hepatitis B Lab Results  Component Value Date   HEPBSAB REACTIVE (A) 07/11/2022   Lab Results  Component Value Date   HEPBSAG NON REACTIVE 09/03/2022    Hepatitis C Lab Results  Component Value Date   HCVAB NON REACTIVE 09/03/2022    Hepatitis A Lab Results  Component Value Date   HAV NON-REACTIVE 01/27/2022    Assessment: Christy Thomas presents today for monthly STI testing per her request. No known exposures to STIs or current symptoms of STIs. Will check RPR and urine/oral cytologies. Politely declines HIV antibody today. Will see her again in 1 month for her next Apretude injection, labs, and PrEP follow-up.   Plan: Check urine/oral cytologies and RPR Follow up on results as needed   Alfonse Spruce, PharmD, CPP, BCIDP, [place] Clinical Pharmacist Practitioner Infectious Diseases [facility] 09/29/2022, 10:57 AM

## 2022-09-30 LAB — C. TRACHOMATIS/N. GONORRHOEAE RNA
C. trachomatis RNA, TMA: NOT DETECTED
N. gonorrhoeae RNA, TMA: NOT DETECTED

## 2022-09-30 LAB — GC/CHLAMYDIA PROBE, AMP (THROAT)
Chlamydia trachomatis RNA: NOT DETECTED
Neisseria gonorrhoeae RNA: NOT DETECTED

## 2022-09-30 LAB — RPR: RPR Ser Ql: NONREACTIVE

## 2022-11-03 ENCOUNTER — Other Ambulatory Visit: Payer: Self-pay

## 2022-11-03 ENCOUNTER — Ambulatory Visit (INDEPENDENT_AMBULATORY_CARE_PROVIDER_SITE_OTHER): Payer: BC Managed Care – PPO | Admitting: Pharmacist

## 2022-11-03 DIAGNOSIS — Z2981 Encounter for HIV pre-exposure prophylaxis: Secondary | ICD-10-CM

## 2022-11-03 DIAGNOSIS — Z79899 Other long term (current) drug therapy: Secondary | ICD-10-CM

## 2022-11-03 DIAGNOSIS — Z113 Encounter for screening for infections with a predominantly sexual mode of transmission: Secondary | ICD-10-CM

## 2022-11-03 MED ORDER — CABOTEGRAVIR ER 600 MG/3ML IM SUER
600.0000 mg | Freq: Once | INTRAMUSCULAR | Status: AC
Start: 2022-11-03 — End: 2022-11-03
  Administered 2022-11-03: 600 mg via INTRAMUSCULAR

## 2022-11-03 NOTE — Progress Notes (Signed)
HPI: Christy Thomas is a 27 y.o. female who presents to the [facility] pharmacy clinic for Apretude administration and HIV PrEP follow up.  Patient Active Problem List   Diagnosis Date Noted   Acute cystitis with hematuria 01/25/2022   Dizziness 01/13/2022   Infection due to Neisseria gonorrhoeae 11/27/2021   Recurrent tonsillitis 11/23/2021   Acute pharyngitis 11/23/2021   Anxiety 11/23/2021   Bipolar 2 disorder, major depressive episode (HCC) 11/23/2021   Panic attacks 11/23/2021   Lymphadenopathy 11/23/2021   Anemia 11/23/2021   ESBL (extended spectrum beta-lactamase) producing bacteria infection 10/18/2021   PICC (peripherally inserted central catheter) in place 10/18/2021   Normocytic anemia 09/30/2021   Acute pyelonephritis 09/28/2021   Hypokalemia 09/28/2021   Urinary tract infection due to extended-spectrum beta lactamase (ESBL) producing Escherichia coli 09/28/2021    Patient's Medications  New Prescriptions   No medications on file  Previous Medications   IBUPROFEN (ADVIL) 200 MG TABLET    Take 400 mg by mouth every 6 (six) hours as needed for headache (pain).   LEVONORGESTREL (KYLEENA) 19.5 MG IUD    Provided by care center   MULTIPLE VITAMIN (MULTIVITAMIN WITH MINERALS) TABS TABLET    Take 1 tablet by mouth every morning.   VALACYCLOVIR (VALTREX) 500 MG TABLET    Take 500 mg by mouth every morning.  Modified Medications   No medications on file  Discontinued Medications   No medications on file    Allergies: No Known Allergies  Past Medical History: Past Medical History:  Diagnosis Date   Gonorrhea 11/27/2021   UTI (urinary tract infection)     Social History: Social History   Socioeconomic History   Marital status: Single    Spouse name: Not on file   Number of children: 0   Years of education: Not on file   Highest education level: Not on file  Occupational History   Not on file  Tobacco Use   Smoking status: Never   Smokeless tobacco: Never   Vaping Use   Vaping Use: Never used  Substance and Sexual Activity   Alcohol use: Not Currently    Comment: occasionally   Drug use: Not Currently    Types: Marijuana   Sexual activity: Not on file  Other Topics Concern   Not on file  Social History Narrative   Not on file   Social Determinants of Health   Financial Resource Strain: Not on file  Food Insecurity: Not on file  Transportation Needs: Not on file  Physical Activity: Not on file  Stress: Not on file  Social Connections: Not on file    Labs: Lab Results  Component Value Date   HIV1RNAQUANT Not Detected 09/01/2022   HIV1RNAQUANT Not Detected 07/11/2022   HIV1RNAQUANT Not Detected 06/06/2022    RPR and STI Lab Results  Component Value Date   LABRPR NON-REACTIVE 09/29/2022   LABRPR NON REACTIVE 09/03/2022   LABRPR NON-REACTIVE 07/11/2022   LABRPR NON-REACTIVE 06/06/2022   LABRPR NON-REACTIVE 05/11/2022    STI Results GC CT  Latest Ref Rng & Units  Negative  08/04/2022  2:07 PM  Negative   07/11/2022  1:54 PM Negative    Negative  Negative    Positive   06/06/2022  2:06 PM Negative    Negative  Negative    Negative   05/11/2022  4:36 PM  Negative   05/11/2022  4:29 PM  Negative   02/24/2022  9:55 AM Negative  Negative   01/25/2022  1:50  PM  Negative    Negative   01/06/2022 12:37 PM Positive  Negative   12/06/2021  1:55 PM  Negative   11/23/2021 10:51 AM  Negative   09/25/2021 11:19 AM Negative  Negative   08/26/2021  1:37 PM Negative  Negative   10/29/2020  6:26 PM Negative  Negative     Hepatitis B Lab Results  Component Value Date   HEPBSAB REACTIVE (A) 07/11/2022   HEPBSAG NON REACTIVE 09/03/2022   Hepatitis C Lab Results  Component Value Date   HEPCAB NON-REACTIVE 02/24/2022   Hepatitis A Lab Results  Component Value Date   HAV NON-REACTIVE 01/27/2022   Lipids: No results found for: "CHOL", "TRIG", "HDL", "CHOLHDL", "VLDL", "LDLCALC"  TARGET DATE: The 28th of the  month  Current PrEP Regimen: Apretude  Assessment: Christy Thomas presents today for their Apretude injection and to follow up for HIV PrEP. No issues with past injections. No known exposures to any STIs and no signs or symptoms of any STIs today. Last STI screening was in March and was negative. Screened patient for acute HIV symptoms such as fatigue, muscle aches, rash, sore throat, lymphadenopathy, headache, night sweats, nausea/vomiting/diarrhea, and fever. Patient denies any symptoms. States she thinks she has been with new partners since last injection. Requests urine/oral GC/CT cytologies today.   Per [employer] guidelines, a rapid HIV test should be drawn prior to Apretude administration. Due to state shortage of rapid HIV tests, this is temporarily unable to be done. Per decision from [facility] physicians, we will proceed with Apretude administration at this time without a negative rapid HIV test beforehand. HIV RNA was collected today and is in process.  Administered cabotegravir 600mg /46mL in left upper outer quadrant of the gluteal muscle. Will make follow up appointments for maintenance injections every 2 months.   Offered final HAV and HPV vaccines today. She would like to defer until next month when she returns for routine STI testing. States she is moving to [place] at end of July as she is finishing up school this summer. Discussed the PrEP locator website today.   Plan: - Maintenance injections scheduled for 6/25 with me - Routine STI screening scheduled for 5/29 with me  - Check HIV RNA and oral/urine GC/CT cytologies - Call with any issues or questions  Margarite Gouge, PharmD, CPP, BCIDP, AAHIVP Clinical Pharmacist Practitioner Infectious Diseases Clinical Pharmacist [facility]

## 2022-11-04 LAB — GC/CHLAMYDIA PROBE, AMP (THROAT)
Chlamydia trachomatis RNA: NOT DETECTED
Neisseria gonorrhoeae RNA: NOT DETECTED

## 2022-11-04 LAB — C. TRACHOMATIS/N. GONORRHOEAE RNA: N. gonorrhoeae RNA, TMA: NOT DETECTED

## 2022-11-05 LAB — HIV-1 RNA QUANT-NO REFLEX-BLD
HIV 1 RNA Quant: NOT DETECTED Copies/mL
HIV-1 RNA Quant, Log: NOT DETECTED Log cps/mL

## 2022-11-05 LAB — C. TRACHOMATIS/N. GONORRHOEAE RNA: C. trachomatis RNA, TMA: NOT DETECTED

## 2022-11-21 ENCOUNTER — Encounter: Payer: Self-pay | Admitting: Internal Medicine

## 2022-11-21 ENCOUNTER — Ambulatory Visit: Payer: BC Managed Care – PPO | Admitting: Internal Medicine

## 2022-11-21 VITALS — BP 102/66 | HR 80 | Temp 98.2°F | Ht 62.0 in | Wt 139.0 lb

## 2022-11-21 DIAGNOSIS — R21 Rash and other nonspecific skin eruption: Secondary | ICD-10-CM

## 2022-11-21 MED ORDER — TRIAMCINOLONE ACETONIDE 0.1 % EX CREA: 1.0000 | TOPICAL_CREAM | Freq: Two times a day (BID) | CUTANEOUS | 0 refills | Status: AC

## 2022-11-21 NOTE — Patient Instructions (Addendum)
      Medications changes include :   triamcinolone twice a day      Return if symptoms worsen or fail to improve.

## 2022-11-21 NOTE — Progress Notes (Signed)
    Subjective:    Patient ID: Christy Thomas, female    DOB: 1995/12/30, 27 y.o.   MRN: [id number]      HPI Christy Thomas is here for  Chief Complaint  Patient presents with   Rash    Rash on back of left thigh and upper butt area. Also has patch on right upper side of back     Rash x 1 week - left upper leg, right leg and back. Itches.  Not pain. Tried non-itch spray - helped a little.  Woke up with it.   - went to a festival in [place] - camping the weekend prior - does not recall having the rash there.  It has not spread or gotten worse since it started.      Medications and allergies reviewed with patient and updated if appropriate.  Current Outpatient Medications on File Prior to Visit  Medication Sig Dispense Refill   ibuprofen (ADVIL) 200 MG tablet Take 400 mg by mouth every 6 (six) hours as needed for headache (pain).     levonorgestrel (KYLEENA) 19.5 MG IUD Provided by care center     Multiple Vitamin (MULTIVITAMIN WITH MINERALS) TABS tablet Take 1 tablet by mouth every morning.     valACYclovir (VALTREX) 500 MG tablet Take 500 mg by mouth every morning.     No current facility-administered medications on file prior to visit.    Review of Systems  Constitutional:  Negative for chills and fever.  Skin:  Positive for rash. Negative for wound.       Objective:   Vitals:   11/21/22 1525  BP: 102/66  Pulse: 80  Temp: 98.2 F (36.8 C)  SpO2: 97%   BP Readings from Last 3 Encounters:  11/21/22 102/66  09/03/22 116/82  08/04/22 92/60   Wt Readings from Last 3 Encounters:  11/21/22 139 lb (63 kg)  09/03/22 135 lb (61.2 kg)  08/24/22 135 lb (61.2 kg)   Body mass index is 25.42 kg/m.    Physical Exam Constitutional:      General: She is not in acute distress.    Appearance: Normal appearance. She is not ill-appearing.  HENT:     Head: Normocephalic and atraumatic.  Musculoskeletal:     Right lower leg: No edema.     Left lower leg: No edema.  Skin:     General: Skin is warm.     Findings: Rash present.     Comments: Rash on left back, left posterior leg erythema very small papules in a cluster - abnormally shaped, minimally raised, no blister, open wounds  Neurological:     Mental Status: She is alert.            Encounter Diagnosis  Name Primary?   Rash and nonspecific skin eruption Yes   Acute Started last week Looks like contact dermatitis - cause unknown Not spreading No concerning features of rash Triamcinolone cream 1% bid until rash is gone - do not use for more than 14 days Call if no improvement

## 2022-12-05 NOTE — Progress Notes (Signed)
   [facility] STI Visit  HPI: Christy Thomas is a 27 y.o. female who presents to the [facility] pharmacy clinic for STI testing.  Hepatitis B Lab Results  Component Value Date   HEPBSAB REACTIVE (A) 07/11/2022   Lab Results  Component Value Date   HEPBSAG NON REACTIVE 09/03/2022    Hepatitis C Lab Results  Component Value Date   HCVAB NON REACTIVE 09/03/2022    Hepatitis A Lab Results  Component Value Date   HAV NON-REACTIVE 01/27/2022    Assessment: Christy Thomas presents today for monthly STI testing per her request. No known exposures to STIs or current symptoms of STIs. Will check RPR and urine/oral cytologies. Politely declines HIV antibody today. Will see her again in 1 month for her next Apretude injection, labs, and PrEP follow-up.   Plan: Check urine/oral cytologies and RPR Follow up on results as needed

## 2022-12-06 ENCOUNTER — Ambulatory Visit: Payer: BC Managed Care – PPO | Admitting: Pharmacist

## 2022-12-06 ENCOUNTER — Other Ambulatory Visit: Payer: Self-pay

## 2022-12-06 ENCOUNTER — Other Ambulatory Visit (HOSPITAL_COMMUNITY)
Admission: RE | Admit: 2022-12-06 | Discharge: 2022-12-06 | Disposition: A | Discharge status: Home / Self Care | Payer: BC Managed Care – PPO | Source: Ambulatory Visit | Attending: Infectious Disease | Admitting: Infectious Disease

## 2022-12-06 DIAGNOSIS — Z23 Encounter for immunization: Secondary | ICD-10-CM

## 2022-12-06 DIAGNOSIS — Z113 Encounter for screening for infections with a predominantly sexual mode of transmission: Secondary | ICD-10-CM | POA: Diagnosis present

## 2022-12-07 LAB — CYTOLOGY, (ORAL, ANAL, URETHRAL) ANCILLARY ONLY
Chlamydia: NEGATIVE
Comment: NEGATIVE
Comment: NORMAL
Neisseria Gonorrhea: NEGATIVE

## 2022-12-07 LAB — URINE CYTOLOGY ANCILLARY ONLY
Chlamydia: NEGATIVE
Comment: NEGATIVE
Comment: NORMAL
Neisseria Gonorrhea: NEGATIVE

## 2022-12-07 LAB — RPR: RPR Ser Ql: NONREACTIVE

## 2023-01-02 ENCOUNTER — Other Ambulatory Visit: Payer: Self-pay

## 2023-01-02 ENCOUNTER — Ambulatory Visit (INDEPENDENT_AMBULATORY_CARE_PROVIDER_SITE_OTHER): Payer: BC Managed Care – PPO | Admitting: Pharmacist

## 2023-01-02 ENCOUNTER — Other Ambulatory Visit (HOSPITAL_COMMUNITY)
Admission: RE | Admit: 2023-01-02 | Discharge: 2023-01-02 | Disposition: A | Discharge status: Home / Self Care | Payer: BC Managed Care – PPO | Source: Ambulatory Visit | Attending: Infectious Disease | Admitting: Infectious Disease

## 2023-01-02 DIAGNOSIS — Z113 Encounter for screening for infections with a predominantly sexual mode of transmission: Secondary | ICD-10-CM | POA: Insufficient documentation

## 2023-01-02 DIAGNOSIS — Z79899 Other long term (current) drug therapy: Secondary | ICD-10-CM

## 2023-01-02 DIAGNOSIS — Z2981 Encounter for HIV pre-exposure prophylaxis: Secondary | ICD-10-CM | POA: Diagnosis not present

## 2023-01-02 MED ORDER — CABOTEGRAVIR ER 600 MG/3ML IM SUER
600.0000 mg | Freq: Once | INTRAMUSCULAR | Status: AC
Start: 2023-01-02 — End: 2023-01-02
  Administered 2023-01-02: 600 mg via INTRAMUSCULAR

## 2023-01-02 NOTE — Progress Notes (Signed)
HPI: Christy Thomas is a 27 y.o. female who presents to the [facility] pharmacy clinic for Apretude administration and HIV PrEP follow up.  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 11/27/2021   Recurrent tonsillitis 11/23/2021   Acute pharyngitis 11/23/2021   Anxiety 11/23/2021   Bipolar 2 disorder, major depressive episode (HCC) 11/23/2021   Panic attacks 11/23/2021   Lymphadenopathy 11/23/2021   Anemia 11/23/2021   ESBL (extended spectrum beta-lactamase) producing bacteria infection 10/18/2021   PICC (peripherally inserted central catheter) in place 10/18/2021   Normocytic anemia 09/30/2021   Acute pyelonephritis 09/28/2021   Hypokalemia 09/28/2021   Urinary tract infection due to extended-spectrum beta lactamase (ESBL) producing Escherichia coli 09/28/2021    Patient's Medications  New Prescriptions   No medications on file  Previous Medications   IBUPROFEN (ADVIL) 200 MG TABLET    Take 400 mg by mouth every 6 (six) hours as needed for headache (pain).   LEVONORGESTREL (KYLEENA) 19.5 MG IUD    Provided by care center   MULTIPLE VITAMIN (MULTIVITAMIN WITH MINERALS) TABS TABLET    Take 1 tablet by mouth every morning.   TRIAMCINOLONE CREAM (KENALOG) 0.1 %    Apply 1 Application topically 2 (two) times daily.   VALACYCLOVIR (VALTREX) 500 MG TABLET    Take 500 mg by mouth every morning.  Modified Medications   No medications on file  Discontinued Medications   No medications on file    Allergies: No Known Allergies  Past Medical History: Past Medical History:  Diagnosis Date   Gonorrhea 11/27/2021   UTI (urinary tract infection)     Social History: Social History   Socioeconomic History   Marital status: Single    Spouse name: Not on file   Number of children: 0   Years of education: Not on file    Highest education level: Not on file  Occupational History   Not on file  Tobacco Use   Smoking status: Never   Smokeless tobacco: Never  Vaping Use   Vaping Use: Never used  Substance and Sexual Activity   Alcohol use: Not Currently    Comment: occasionally   Drug use: Not Currently    Types: Marijuana   Sexual activity: Not on file  Other Topics Concern   Not on file  Social History Narrative   Not on file   Social Determinants of Health   Financial Resource Strain: Not on file  Food Insecurity: Not on file  Transportation Needs: Not on file  Physical Activity: Not on file  Stress: Not on file  Social Connections: Not on file    Labs: Lab Results  Component Value Date   HIV1RNAQUANT Not Detected 11/03/2022   HIV1RNAQUANT Not Detected 09/01/2022   HIV1RNAQUANT Not Detected 07/11/2022    RPR and STI Lab Results  Component Value Date   LABRPR NON-REACTIVE 12/06/2022   LABRPR NON-REACTIVE 09/29/2022   LABRPR NON REACTIVE 09/03/2022   LABRPR NON-REACTIVE 07/11/2022   LABRPR NON-REACTIVE 06/06/2022    STI Results GC CT  12/06/2022  2:36 PM Negative    Negative  Negative    Negative   08/04/2022  2:07 PM  Negative   07/11/2022  1:54 PM Negative    Negative  Negative    Positive   06/06/2022  2:06 PM Negative    Negative  Negative    Negative   05/11/2022  4:36 PM  Negative   05/11/2022  4:29 PM  Negative   02/24/2022  9:55 AM Negative  Negative   01/25/2022  1:50 PM  Negative    Negative   01/06/2022 12:37 PM Positive  Negative   12/06/2021  1:55 PM  Negative   11/23/2021 10:51 AM  Negative   09/25/2021 11:19 AM Negative  Negative   08/26/2021  1:37 PM Negative  Negative   10/29/2020  6:26 PM Negative  Negative     Hepatitis B Lab Results  Component Value Date   HEPBSAB REACTIVE (A) 07/11/2022   HEPBSAG NON REACTIVE 09/03/2022   Hepatitis C Lab Results  Component Value Date   HEPCAB NON-REACTIVE 02/24/2022   Hepatitis A Lab Results   Component Value Date   HAV NON-REACTIVE 01/27/2022   Lipids: No results found for: "CHOL", "TRIG", "HDL", "CHOLHDL", "VLDL", "LDLCALC"  TARGET DATE: The 28th of the month  Current PrEP Regimen: Apretude  Assessment: Christy Thomas presents today for their Apretude injection and to follow up for HIV PrEP. No issues with past injections. No known exposures to any STIs and no signs or symptoms of any STIs today. Last STI screening was in May and was negative. Screened patient for acute HIV symptoms such as fatigue, muscle aches, rash, sore throat, lymphadenopathy, headache, night sweats, nausea/vomiting/diarrhea, and fever. Patient denies any symptoms. Cannot remember if she has been with new partners since last injection but thinks she has been. Will check urine/oral cytologies along with HIV RNA today.   Per [employer] guidelines, a rapid HIV test should be drawn prior to Apretude administration. Due to state shortage of rapid HIV tests, this is temporarily unable to be done. Per decision from [facility] physicians, we will proceed with Apretude administration at this time without a negative rapid HIV test beforehand. HIV RNA was collected today and is in process.  Administered cabotegravir 600mg /65mL in left upper outer quadrant of the gluteal muscle. Will make follow up appointments for maintenance injections every 2 months.   She is moving to [place] in mid-August before her next injection window. States she plans to transition off of Apretude as she is moving up to [place] to live with her boyfriend now fiance and will be exclusive with him. Still scheduled for routine STI testing next month per her request.  Plan: - Check HIV RNA and urine/oral cytologies  - Routine STI screening with me on 7/31  - Call with any issues or questions  Margarite Gouge, PharmD, CPP, BCIDP, AAHIVP Clinical Pharmacist Practitioner Infectious Diseases Clinical Pharmacist [facility]

## 2023-01-03 LAB — URINE CYTOLOGY ANCILLARY ONLY
Chlamydia: NEGATIVE
Comment: NEGATIVE
Comment: NORMAL
Neisseria Gonorrhea: NEGATIVE

## 2023-01-03 LAB — CYTOLOGY, (ORAL, ANAL, URETHRAL) ANCILLARY ONLY
Chlamydia: NEGATIVE
Comment: NEGATIVE
Comment: NORMAL
Neisseria Gonorrhea: NEGATIVE

## 2023-01-04 LAB — HIV-1 RNA QUANT-NO REFLEX-BLD
HIV 1 RNA Quant: NOT DETECTED Copies/mL
HIV-1 RNA Quant, Log: NOT DETECTED Log cps/mL

## 2023-02-07 ENCOUNTER — Ambulatory Visit (INDEPENDENT_AMBULATORY_CARE_PROVIDER_SITE_OTHER): Payer: BC Managed Care – PPO | Admitting: Pharmacist

## 2023-02-07 ENCOUNTER — Other Ambulatory Visit (HOSPITAL_COMMUNITY)
Admission: RE | Admit: 2023-02-07 | Discharge: 2023-02-07 | Disposition: A | Discharge status: Home / Self Care | Payer: BC Managed Care – PPO | Source: Ambulatory Visit | Attending: Infectious Disease | Admitting: Infectious Disease

## 2023-02-07 ENCOUNTER — Other Ambulatory Visit: Payer: Self-pay

## 2023-02-07 DIAGNOSIS — Z113 Encounter for screening for infections with a predominantly sexual mode of transmission: Secondary | ICD-10-CM | POA: Insufficient documentation

## 2023-02-07 DIAGNOSIS — Z79899 Other long term (current) drug therapy: Secondary | ICD-10-CM | POA: Diagnosis not present

## 2023-02-07 NOTE — Progress Notes (Signed)
Date:  02/07/2023   HPI: Christy Thomas is a 27 y.o. female who presents to the [facility] pharmacy clinic for SI testing.  Insured   [x]    Uninsured  []    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 11/27/2021   Recurrent tonsillitis 11/23/2021   Acute pharyngitis 11/23/2021   Anxiety 11/23/2021   Bipolar 2 disorder, major depressive episode (HCC) 11/23/2021   Panic attacks 11/23/2021   Lymphadenopathy 11/23/2021   Anemia 11/23/2021   ESBL (extended spectrum beta-lactamase) producing bacteria infection 10/18/2021   PICC (peripherally inserted central catheter) in place 10/18/2021   Normocytic anemia 09/30/2021   Acute pyelonephritis 09/28/2021   Hypokalemia 09/28/2021   Urinary tract infection due to extended-spectrum beta lactamase (ESBL) producing Escherichia coli 09/28/2021    Patient's Medications  New Prescriptions   No medications on file  Previous Medications   IBUPROFEN (ADVIL) 200 MG TABLET    Take 400 mg by mouth every 6 (six) hours as needed for headache (pain).   LEVONORGESTREL (KYLEENA) 19.5 MG IUD    Provided by care center   MULTIPLE VITAMIN (MULTIVITAMIN WITH MINERALS) TABS TABLET    Take 1 tablet by mouth every morning.   TRIAMCINOLONE CREAM (KENALOG) 0.1 %    Apply 1 Application topically 2 (two) times daily.   VALACYCLOVIR (VALTREX) 500 MG TABLET    Take 500 mg by mouth every morning.  Modified Medications   No medications on file  Discontinued Medications   No medications on file    Allergies: No Known Allergies  Past Medical History: Past Medical History:  Diagnosis Date   Gonorrhea 11/27/2021   UTI (urinary tract infection)     Social History: Social History   Socioeconomic History   Marital status: Single    Spouse name: Not on file   Number of children: 0   Years of education:  Not on file   Highest education level: Not on file  Occupational History   Not on file  Tobacco Use   Smoking status: Never   Smokeless tobacco: Never  Vaping Use   Vaping status: Never Used  Substance and Sexual Activity   Alcohol use: Not Currently    Comment: occasionally   Drug use: Not Currently    Types: Marijuana   Sexual activity: Not on file  Other Topics Concern   Not on file  Social History Narrative   Not on file   Social Determinants of Health   Financial Resource Strain: Not on file  Food Insecurity: Not on file  Transportation Needs: Not on file  Physical Activity: Not on file  Stress: Not on file  Social Connections: Not on file       01/27/2022    9:56 AM  [facility] HIV PREP FLOWSHEET RESULTS  Insurance Status Insured  How did you hear? friend recommended  Gender at birth Female  Gender identity cis-Female  Sex Partners Both men and women  # sex partners past 3-6 mos 6-8  Sex activity preferences Receptive;Oral  Condom use Yes  % condom use 80  Treated for STI? Yes  HIV symptoms? Sore throat;High Fever;Flu-like/Mono-like symptoms;Swollen lymph nodes in your neck  PrEP Eligibility Yes  Preg status No  Breastfeeding? No  Paper work received? Yes    Labs:  SCr: Lab Results  Component Value Date   CREATININE  0.78 09/03/2022   CREATININE 0.86 01/13/2022   CREATININE 1.11 (H) 01/06/2022   CREATININE 0.81 11/23/2021   CREATININE 0.82 09/29/2021   HIV Lab Results  Component Value Date   HIV NON REACTIVE 09/03/2022   HIV NON-REACTIVE 05/11/2022   HIV NON-REACTIVE 04/28/2022   HIV NON-REACTIVE 02/24/2022   HIV NON-REACTIVE 01/27/2022   Hepatitis B Lab Results  Component Value Date   HEPBSAB REACTIVE (A) 07/11/2022   HEPBSAG NON REACTIVE 09/03/2022   Hepatitis C Lab Results  Component Value Date   HEPCAB NON-REACTIVE 02/24/2022   Hepatitis A Lab Results  Component Value Date   HAV NON-REACTIVE 01/27/2022   RPR and STI Lab Results   Component Value Date   LABRPR NON-REACTIVE 12/06/2022   LABRPR NON-REACTIVE 09/29/2022   LABRPR NON REACTIVE 09/03/2022   LABRPR NON-REACTIVE 07/11/2022   LABRPR NON-REACTIVE 06/06/2022    STI Results GC CT  01/02/2023  2:33 PM Negative    Negative  Negative    Negative   12/06/2022  2:36 PM Negative    Negative  Negative    Negative   08/04/2022  2:07 PM  Negative   07/11/2022  1:54 PM Negative    Negative  Negative    Positive   06/06/2022  2:06 PM Negative    Negative  Negative    Negative   05/11/2022  4:36 PM  Negative   05/11/2022  4:29 PM  Negative   02/24/2022  9:55 AM Negative  Negative   01/25/2022  1:50 PM  Negative    Negative   01/06/2022 12:37 PM Positive  Negative   12/06/2021  1:55 PM  Negative   11/23/2021 10:51 AM  Negative   09/25/2021 11:19 AM Negative  Negative   08/26/2021  1:37 PM Negative  Negative   10/29/2020  6:26 PM Negative  Negative     Assessment: Christy Thomas presents to clinic today for routine monthly STI testing. She denies any concerning symptoms. Will check urine/oral cytologies and RPR today. This would have been her last appointment with [place] before moving to [place] with her fiance; however, today she requests one more Apretude injection next month. She will be stopping PrEP after this upcoming appointment as she will be exclusive with her fiance at that time. Reviewed that Apretude will remain in her system for several months up to a year probably but that the concentrations will be too low after two months to protect her against acquiring HIV.   Plan: - Check urine/oral cytologies and RPR - Follow-up with me on 8/29   Margarite Gouge, PharmD, CPP, BCIDP, AAHIVP Clinical Pharmacist Practitioner Infectious Diseases Clinical Pharmacist [facility] 02/07/2023, 2:25 PM

## 2023-03-07 NOTE — Progress Notes (Incomplete)
HPI: Christy Thomas is a 27 y.o. female who presents to the [facility] pharmacy clinic for Apretude administration and HIV PrEP follow up.  Insured   [x]    Uninsured  []    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 11/27/2021   Recurrent tonsillitis 11/23/2021   Acute pharyngitis 11/23/2021   Anxiety 11/23/2021   Bipolar 2 disorder, major depressive episode (HCC) 11/23/2021   Panic attacks 11/23/2021   Lymphadenopathy 11/23/2021   Anemia 11/23/2021   ESBL (extended spectrum beta-lactamase) producing bacteria infection 10/18/2021   PICC (peripherally inserted central catheter) in place 10/18/2021   Normocytic anemia 09/30/2021   Acute pyelonephritis 09/28/2021   Hypokalemia 09/28/2021   Urinary tract infection due to extended-spectrum beta lactamase (ESBL) producing Escherichia coli 09/28/2021    Patient's Medications  New Prescriptions   No medications on file  Previous Medications   IBUPROFEN (ADVIL) 200 MG TABLET    Take 400 mg by mouth every 6 (six) hours as needed for headache (pain).   LEVONORGESTREL (KYLEENA) 19.5 MG IUD    Provided by care center   MULTIPLE VITAMIN (MULTIVITAMIN WITH MINERALS) TABS TABLET    Take 1 tablet by mouth every morning.   TRIAMCINOLONE CREAM (KENALOG) 0.1 %    Apply 1 Application topically 2 (two) times daily.   VALACYCLOVIR (VALTREX) 500 MG TABLET    Take 500 mg by mouth every morning.  Modified Medications   No medications on file  Discontinued Medications   No medications on file    Allergies: No Known Allergies  Labs: Lab Results  Component Value Date   HIV1RNAQUANT Not Detected 01/02/2023   HIV1RNAQUANT Not Detected 11/03/2022   HIV1RNAQUANT Not Detected 09/01/2022    RPR and STI Lab Results  Component Value Date   LABRPR NON-REACTIVE 02/07/2023   LABRPR  NON-REACTIVE 12/06/2022   LABRPR NON-REACTIVE 09/29/2022   LABRPR NON REACTIVE 09/03/2022   LABRPR NON-REACTIVE 07/11/2022    STI Results GC CT  02/07/2023  2:06 PM Negative    Negative  Negative    Negative   01/02/2023  2:33 PM Negative    Negative  Negative    Negative   12/06/2022  2:36 PM Negative    Negative  Negative    Negative   08/04/2022  2:07 PM  Negative   07/11/2022  1:54 PM Negative    Negative  Negative    Positive   06/06/2022  2:06 PM Negative    Negative  Negative    Negative   05/11/2022  4:36 PM  Negative   05/11/2022  4:29 PM  Negative   02/24/2022  9:55 AM Negative  Negative   01/25/2022  1:50 PM  Negative    Negative   01/06/2022 12:37 PM Positive  Negative   12/06/2021  1:55 PM  Negative   11/23/2021 10:51 AM  Negative   09/25/2021 11:19 AM Negative  Negative   08/26/2021  1:37 PM Negative  Negative   10/29/2020  6:26 PM Negative  Negative     Hepatitis B Lab Results  Component Value Date   HEPBSAB REACTIVE (A) 07/11/2022   HEPBSAG NON REACTIVE 09/03/2022   Hepatitis C Lab Results  Component Value Date   HEPCAB NON-REACTIVE 02/24/2022   Hepatitis A Lab Results  Component Value Date   HAV NON-REACTIVE 01/27/2022   Lipids: No  results found for: "CHOL", "TRIG", "HDL", "CHOLHDL", "VLDL", "LDLCALC"  TARGET DATE: The 29th of the month  Assessment: [name] presents today for her Apretude injection and to follow up for HIV PrEP. No issues with past injections. Denies any symptoms of acute HIV. Last STI screening was on 02/07/23 and was negative. No known exposures to any STIs since last visit. ***Agrees to full STI testing today with RPR and oral/urine/rectal cytologies.   Per [employer] guidelines, a rapid HIV test should be drawn prior to Apretude administration. Due to state shortage of rapid HIV tests, this is temporarily unable to be done. Per decision from [facility] physicians, we will proceed with Apretude administration at this time  without a negative rapid HIV test beforehand. HIV RNA was collected today and is in process.  Administered cabotegravir 600mg /76mL in *** upper outer quadrant of the gluteal muscle. Will see *** back in 2 months for injection, labs, and HIV PrEP follow up.  Moving? Last appointment today?  Plan: - Apretude injection administered - HIV RNA today - Next injection, labs, and PrEP follow up appointment scheduled for *** - Call with any issues or questions  Lennie Muckle, PharmD PGY1 Pharmacy Resident 03/07/2023 12:52 PM

## 2023-03-08 ENCOUNTER — Ambulatory Visit: Payer: BC Managed Care – PPO | Admitting: Pharmacist

## 2023-03-29 ENCOUNTER — Other Ambulatory Visit (HOSPITAL_COMMUNITY): Payer: Self-pay

## 2023-03-29 ENCOUNTER — Telehealth: Payer: Self-pay

## 2023-03-29 NOTE — Telephone Encounter (Signed)
[facility] Patient Advocate Encounter  Patient insurance termed, I called patient can left a VM for patient to call me back if she have new insurance.   Clearance Coots, CPhT Specialty Pharmacy Patient [facility] for Infectious Disease Phone: [phone] Fax:  [phone]

## 2023-04-02 ENCOUNTER — Other Ambulatory Visit (HOSPITAL_COMMUNITY): Payer: Self-pay

## 2023-04-02 NOTE — Progress Notes (Unsigned)
HPI: Christy Thomas is a 27 y.o. female who presents to the [facility] pharmacy clinic for Apretude administration and HIV PrEP follow up.  Insured   []    Uninsured  [x]    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 11/27/2021   Recurrent tonsillitis 11/23/2021   Acute pharyngitis 11/23/2021   Anxiety 11/23/2021   Bipolar 2 disorder, major depressive episode (HCC) 11/23/2021   Panic attacks 11/23/2021   Lymphadenopathy 11/23/2021   Anemia 11/23/2021   ESBL (extended spectrum beta-lactamase) producing bacteria infection 10/18/2021   PICC (peripherally inserted central catheter) in place 10/18/2021   Normocytic anemia 09/30/2021   Acute pyelonephritis 09/28/2021   Hypokalemia 09/28/2021   Urinary tract infection due to extended-spectrum beta lactamase (ESBL) producing Escherichia coli 09/28/2021    Patient's Medications  New Prescriptions   No medications on file  Previous Medications   IBUPROFEN (ADVIL) 200 MG TABLET    Take 400 mg by mouth every 6 (six) hours as needed for headache (pain).   LEVONORGESTREL (KYLEENA) 19.5 MG IUD    Provided by care center   MULTIPLE VITAMIN (MULTIVITAMIN WITH MINERALS) TABS TABLET    Take 1 tablet by mouth every morning.   TRIAMCINOLONE CREAM (KENALOG) 0.1 %    Apply 1 Application topically 2 (two) times daily.   VALACYCLOVIR (VALTREX) 500 MG TABLET    Take 500 mg by mouth every morning.  Modified Medications   No medications on file  Discontinued Medications   No medications on file    Allergies: No Known Allergies  Labs: Lab Results  Component Value Date   HIV1RNAQUANT Not Detected 01/02/2023   HIV1RNAQUANT Not Detected 11/03/2022   HIV1RNAQUANT Not Detected 09/01/2022    RPR and STI Lab Results  Component Value Date   LABRPR NON-REACTIVE 02/07/2023   LABRPR  NON-REACTIVE 12/06/2022   LABRPR NON-REACTIVE 09/29/2022   LABRPR NON REACTIVE 09/03/2022   LABRPR NON-REACTIVE 07/11/2022    STI Results GC CT  02/07/2023  2:06 PM Negative    Negative  Negative    Negative   01/02/2023  2:33 PM Negative    Negative  Negative    Negative   12/06/2022  2:36 PM Negative    Negative  Negative    Negative   08/04/2022  2:07 PM  Negative   07/11/2022  1:54 PM Negative    Negative  Negative    Positive   06/06/2022  2:06 PM Negative    Negative  Negative    Negative   05/11/2022  4:36 PM  Negative   05/11/2022  4:29 PM  Negative   02/24/2022  9:55 AM Negative  Negative   01/25/2022  1:50 PM  Negative    Negative   01/06/2022 12:37 PM Positive  Negative   12/06/2021  1:55 PM  Negative   11/23/2021 10:51 AM  Negative   09/25/2021 11:19 AM Negative  Negative   08/26/2021  1:37 PM Negative  Negative   10/29/2020  6:26 PM Negative  Negative     Hepatitis B Lab Results  Component Value Date   HEPBSAB REACTIVE (A) 07/11/2022   HEPBSAG NON REACTIVE 09/03/2022   Hepatitis C Lab Results  Component Value Date   HEPCAB NON-REACTIVE 02/24/2022   Hepatitis A Lab Results  Component Value Date   HAV NON-REACTIVE 01/27/2022   Lipids: No  results found for: "CHOL", "TRIG", "HDL", "CHOLHDL", "VLDL", "LDLCALC"  TARGET DATE: 28th of the August  Assessment: Christy Thomas presents today for PrEP follow up with Apretude injections. Denies any symptoms of acute HIV. Last STI screening was on 02/07/23 and was negative. No known exposures to any STIs since last visit. Christy Thomas requests full STI testing today with RPR and oral/urine cytologies. Currently almost 3 months since last injection.  Unfortunately due to issues with insurance, patient is unable to get injection today. Will follow up with PAP vs insurance resolution ASAP. She has filled out PAP forms in office today. Discussed with patient likely need to reload if we do not get injection within the end of the  month. Patient planning to move in late November. Was initially planning to stop PrEP. Reports that she several partners at this time, and would like to continue PrEP with Apretude. Has had some issues with oral PrEP in the past. Emphasized importance of condom use, and that patient will not be adequately protected from HIV if Apretude not resumed in a timely manner. She expresses understanding.  Plan: - Follow up visit scheduled in one month 05/09/23 with Margarite Gouge, PharmD - Follow up restarting Apretude as soon as able through PAP or insurance - HIV RNA, RPR, oral/urine cytologies - Call with any issues or questions  Christy Thomas, PharmD PGY-2 Infectious Diseases Pharmacy Resident 04/02/2023 7:31 PM

## 2023-04-03 ENCOUNTER — Other Ambulatory Visit: Payer: Self-pay

## 2023-04-03 ENCOUNTER — Ambulatory Visit (INDEPENDENT_AMBULATORY_CARE_PROVIDER_SITE_OTHER): Payer: Self-pay | Admitting: Pharmacist

## 2023-04-03 ENCOUNTER — Other Ambulatory Visit (HOSPITAL_COMMUNITY): Payer: Self-pay

## 2023-04-03 DIAGNOSIS — Z79899 Other long term (current) drug therapy: Secondary | ICD-10-CM

## 2023-04-04 LAB — URINE CYTOLOGY ANCILLARY ONLY
Chlamydia: NEGATIVE
Comment: NEGATIVE
Comment: NORMAL
Neisseria Gonorrhea: NEGATIVE

## 2023-04-04 LAB — CYTOLOGY, (ORAL, ANAL, URETHRAL) ANCILLARY ONLY
Chlamydia: NEGATIVE
Comment: NEGATIVE
Comment: NORMAL
Neisseria Gonorrhea: NEGATIVE

## 2023-04-05 ENCOUNTER — Telehealth: Payer: Self-pay

## 2023-04-05 ENCOUNTER — Telehealth: Payer: Self-pay | Admitting: Pharmacist

## 2023-04-05 ENCOUNTER — Encounter: Payer: Self-pay | Admitting: Pharmacist

## 2023-04-05 LAB — HIV-1 RNA QUANT-NO REFLEX-BLD
HIV 1 RNA Quant: NOT DETECTED Copies/mL
HIV-1 RNA Quant, Log: NOT DETECTED Log cps/mL

## 2023-04-05 LAB — RPR: RPR Ser Ql: NONREACTIVE

## 2023-04-05 NOTE — Telephone Encounter (Signed)
[facility] Patient Advocate Encounter  Completed and sent [employer] application for Apretude for this patient who is uninsured.    Patient is approved 04/04/23 through 04/03/24. Patient ID # [id number]   Medication will be shipped to the office.   Clearance Coots, CPhT Specialty Pharmacy Patient [facility] for Infectious Disease Phone: [phone] Fax:  [phone]

## 2023-04-05 NOTE — Telephone Encounter (Signed)
LVM with patient to reschedule Apretude appointment given [employer] assistance approval. Will await returned call or MyChart message.  Margarite Gouge, PharmD, CPP, BCIDP, AAHIVP Clinical Pharmacist Practitioner Infectious Diseases Clinical Pharmacist [facility] for Infectious Disease

## 2023-04-10 ENCOUNTER — Other Ambulatory Visit (HOSPITAL_COMMUNITY): Payer: Self-pay

## 2023-05-04 ENCOUNTER — Telehealth: Payer: Self-pay

## 2023-05-04 NOTE — Telephone Encounter (Signed)
 [facility] Patient Advocate Encounter  Patient's medications (APRETUDE) have been couriered to [facility] from [facility] and will be administered at the patients appointment on 05/09/23.  Kae Heller , CPhT Specialty Pharmacy Patient [facility] for Infectious Disease Phone: [phone] Fax:  [phone]

## 2023-05-09 ENCOUNTER — Ambulatory Visit (INDEPENDENT_AMBULATORY_CARE_PROVIDER_SITE_OTHER): Payer: Self-pay | Admitting: Pharmacist

## 2023-05-09 ENCOUNTER — Other Ambulatory Visit: Payer: Self-pay

## 2023-05-09 DIAGNOSIS — Z79899 Other long term (current) drug therapy: Secondary | ICD-10-CM

## 2023-05-09 DIAGNOSIS — Z113 Encounter for screening for infections with a predominantly sexual mode of transmission: Secondary | ICD-10-CM

## 2023-05-09 LAB — POCT URINE PREGNANCY: Preg Test, Ur: NEGATIVE

## 2023-05-09 MED ORDER — CABOTEGRAVIR ER 600 MG/3ML IM SUER
600.0000 mg | Freq: Once | INTRAMUSCULAR | Status: AC
Start: 2023-05-09 — End: 2023-05-09
  Administered 2023-05-09: 600 mg via INTRAMUSCULAR

## 2023-05-09 NOTE — Progress Notes (Signed)
HPI: Christy Thomas is a 27 y.o. female who presents to the [facility] pharmacy clinic for Apretude administration and HIV PrEP follow up.  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 11/27/2021   Recurrent tonsillitis 11/23/2021   Acute pharyngitis 11/23/2021   Anxiety 11/23/2021   Bipolar 2 disorder, major depressive episode (HCC) 11/23/2021   Panic attacks 11/23/2021   Lymphadenopathy 11/23/2021   Anemia 11/23/2021   ESBL (extended spectrum beta-lactamase) producing bacteria infection 10/18/2021   PICC (peripherally inserted central catheter) in place 10/18/2021   Normocytic anemia 09/30/2021   Acute pyelonephritis 09/28/2021   Hypokalemia 09/28/2021   Urinary tract infection due to extended-spectrum beta lactamase (ESBL) producing Escherichia coli 09/28/2021    Patient's Medications  New Prescriptions   No medications on file  Previous Medications   IBUPROFEN (ADVIL) 200 MG TABLET    Take 400 mg by mouth every 6 (six) hours as needed for headache (pain).   LEVONORGESTREL (KYLEENA) 19.5 MG IUD    Provided by care center   MULTIPLE VITAMIN (MULTIVITAMIN WITH MINERALS) TABS TABLET    Take 1 tablet by mouth every morning.   TRIAMCINOLONE CREAM (KENALOG) 0.1 %    Apply 1 Application topically 2 (two) times daily.   VALACYCLOVIR (VALTREX) 500 MG TABLET    Take 500 mg by mouth every morning.  Modified Medications   No medications on file  Discontinued Medications   No medications on file    Allergies: No Known Allergies  Past Medical History: Past Medical History:  Diagnosis Date   Gonorrhea 11/27/2021   UTI (urinary tract infection)     Social History: Social History   Socioeconomic History   Marital status: Single    Spouse name: Not on file   Number of children: 0   Years of education: Not on file    Highest education level: Not on file  Occupational History   Not on file  Tobacco Use   Smoking status: Never   Smokeless tobacco: Never  Vaping Use   Vaping status: Never Used  Substance and Sexual Activity   Alcohol use: Not Currently    Comment: occasionally   Drug use: Not Currently    Types: Marijuana   Sexual activity: Not on file  Other Topics Concern   Not on file  Social History Narrative   Not on file   Social Determinants of Health   Financial Resource Strain: Not on file  Food Insecurity: Not on file  Transportation Needs: Not on file  Physical Activity: Not on file  Stress: Not on file  Social Connections: Not on file    Labs: Lab Results  Component Value Date   HIV1RNAQUANT Not Detected 04/03/2023   HIV1RNAQUANT Not Detected 01/02/2023   HIV1RNAQUANT Not Detected 11/03/2022    RPR and STI Lab Results  Component Value Date   LABRPR NON-REACTIVE 04/03/2023   LABRPR NON-REACTIVE 02/07/2023   LABRPR NON-REACTIVE 12/06/2022   LABRPR NON-REACTIVE 09/29/2022   LABRPR NON REACTIVE 09/03/2022    STI Results GC CT  04/03/2023  2:34 PM Negative    Negative  Negative    Negative   02/07/2023  2:06 PM Negative    Negative  Negative    Negative   01/02/2023  2:33 PM Negative    Negative  Negative    Negative   12/06/2022  2:36 PM Negative    Negative  Negative    Negative   08/04/2022  2:07 PM  Negative   07/11/2022  1:54 PM Negative    Negative  Negative    Positive   06/06/2022  2:06 PM Negative    Negative  Negative    Negative   05/11/2022  4:36 PM  Negative   05/11/2022  4:29 PM  Negative   02/24/2022  9:55 AM Negative  Negative   01/25/2022  1:50 PM  Negative    Negative   01/06/2022 12:37 PM Positive  Negative   12/06/2021  1:55 PM  Negative   11/23/2021 10:51 AM  Negative   09/25/2021 11:19 AM Negative  Negative   08/26/2021  1:37 PM Negative  Negative   10/29/2020  6:26 PM Negative  Negative     Hepatitis B Lab Results   Component Value Date   HEPBSAB REACTIVE (A) 07/11/2022   HEPBSAG NON REACTIVE 09/03/2022   Hepatitis C Lab Results  Component Value Date   HEPCAB NON-REACTIVE 02/24/2022   Hepatitis A Lab Results  Component Value Date   HAV NON-REACTIVE 01/27/2022   Lipids: No results found for: "CHOL", "TRIG", "HDL", "CHOLHDL", "VLDL", "LDLCALC"  TARGET DATE: The 30th of the month  Assessment: Christy Thomas presents today for their Apretude injection and to follow up for HIV PrEP. No issues with past injections.  Screened patient for acute HIV symptoms such as fatigue, muscle aches, rash, sore throat, lymphadenopathy, headache, night sweats, nausea/vomiting/diarrhea, and fever. Patient denies any symptoms.   She is restarting her Apretude loading doses today given lapse in injections after loss of insurance. Reached out to her for an earlier appointment this month without response; states she has experienced connection issues with her new phone.  Will receive another dose in 1 month and will reset target date to the 30th. She will officially be moving out of [place] on December 1st and will likely stop PrEP at that time given a lower risk of HIV acquisition. Encouraged her to review PrEP Locator and Apretude locations websites if she would like to continue PrEP after leaving [place].   Per [employer] guidelines, a rapid HIV test should be drawn prior to Apretude administration. Due to state shortage of rapid HIV tests, this is temporarily unable to be done. Per decision from [facility] physicians, we will proceed with Apretude administration at this time without a negative rapid HIV test beforehand. HIV RNA was collected today and is in process.  Administered cabotegravir 600mg /63mL in left upper outer quadrant of the gluteal muscle. Will make follow up appointments for maintenance injections every 2 months.   No known exposures to any STIs and no signs or symptoms of any STIs today.  Will check urine pregnancy test, RPR,  and urine/oral cytologies today.   Plan: - Administer Apretude 600 mg x 1  - Maintenance injections scheduled for 11/27 with me 3 - Check HIV RNA, urine pregnancy test, RPR, and urine/oral cytologies  - Call with any issues or questions  Margarite Gouge, PharmD, CPP, BCIDP, AAHIVP Clinical Pharmacist Practitioner Infectious Diseases Clinical Pharmacist [facility]

## 2023-05-10 LAB — CYTOLOGY, (ORAL, ANAL, URETHRAL) ANCILLARY ONLY
Chlamydia: NEGATIVE
Comment: NEGATIVE
Comment: NEGATIVE
Comment: NORMAL
Neisseria Gonorrhea: NEGATIVE
Trichomonas: NEGATIVE

## 2023-05-10 LAB — URINE CYTOLOGY ANCILLARY ONLY
Chlamydia: NEGATIVE
Comment: NEGATIVE
Comment: NEGATIVE
Comment: NORMAL
Neisseria Gonorrhea: NEGATIVE
Trichomonas: NEGATIVE

## 2023-05-10 LAB — PREGNANCY, URINE: Preg Test, Ur: NEGATIVE

## 2023-05-12 LAB — HIV-1 RNA QUANT-NO REFLEX-BLD
HIV 1 RNA Quant: NOT DETECTED {copies}/mL
HIV-1 RNA Quant, Log: NOT DETECTED {Log_copies}/mL

## 2023-05-12 LAB — RPR: RPR Ser Ql: NONREACTIVE

## 2023-05-28 ENCOUNTER — Other Ambulatory Visit (HOSPITAL_COMMUNITY): Payer: Self-pay

## 2023-05-31 ENCOUNTER — Telehealth: Payer: Self-pay

## 2023-05-31 NOTE — Telephone Encounter (Signed)
[facility] Patient Advocate Encounter  Patient's medications APRETUDE have been couriered to [facility] from [employer] and will be administered at the patients appointment on 06/05/23.  Kae Heller, CPhT Specialty Pharmacy Patient [facility] for Infectious Disease Phone: [phone] Fax:  [phone]

## 2023-06-04 NOTE — Progress Notes (Unsigned)
HPI: Christy Thomas is a 27 y.o. female who presents to the [facility] pharmacy clinic for Apretude administration and HIV PrEP follow up.  Insured   []    Uninsured  [x]    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 11/27/2021   Recurrent tonsillitis 11/23/2021   Acute pharyngitis 11/23/2021   Anxiety 11/23/2021   Bipolar 2 disorder, major depressive episode (HCC) 11/23/2021   Panic attacks 11/23/2021   Lymphadenopathy 11/23/2021   Anemia 11/23/2021   ESBL (extended spectrum beta-lactamase) producing bacteria infection 10/18/2021   PICC (peripherally inserted central catheter) in place 10/18/2021   Normocytic anemia 09/30/2021   Acute pyelonephritis 09/28/2021   Hypokalemia 09/28/2021   Urinary tract infection due to extended-spectrum beta lactamase (ESBL) producing Escherichia coli 09/28/2021    Patient's Medications  New Prescriptions   No medications on file  Previous Medications   IBUPROFEN (ADVIL) 200 MG TABLET    Take 400 mg by mouth every 6 (six) hours as needed for headache (pain).   LEVONORGESTREL (KYLEENA) 19.5 MG IUD    Provided by care center   MULTIPLE VITAMIN (MULTIVITAMIN WITH MINERALS) TABS TABLET    Take 1 tablet by mouth every morning.   TRIAMCINOLONE CREAM (KENALOG) 0.1 %    Apply 1 Application topically 2 (two) times daily.   VALACYCLOVIR (VALTREX) 500 MG TABLET    Take 500 mg by mouth every morning.  Modified Medications   No medications on file  Discontinued Medications   No medications on file    Allergies: No Known Allergies  Labs: Lab Results  Component Value Date   HIV1RNAQUANT Not Detected 05/09/2023   HIV1RNAQUANT Not Detected 04/03/2023   HIV1RNAQUANT Not Detected 01/02/2023    RPR and STI Lab Results  Component Value Date   LABRPR NON-REACTIVE 05/09/2023   LABRPR  NON-REACTIVE 04/03/2023   LABRPR NON-REACTIVE 02/07/2023   LABRPR NON-REACTIVE 12/06/2022   LABRPR NON-REACTIVE 09/29/2022    STI Results GC CT  05/09/2023  1:12 PM Negative    Negative  Negative    Negative   04/03/2023  2:34 PM Negative    Negative  Negative    Negative   02/07/2023  2:06 PM Negative    Negative  Negative    Negative   01/02/2023  2:33 PM Negative    Negative  Negative    Negative   12/06/2022  2:36 PM Negative    Negative  Negative    Negative   08/04/2022  2:07 PM  Negative   07/11/2022  1:54 PM Negative    Negative  Negative    Positive   06/06/2022  2:06 PM Negative    Negative  Negative    Negative   05/11/2022  4:36 PM  Negative   05/11/2022  4:29 PM  Negative   02/24/2022  9:55 AM Negative  Negative   01/25/2022  1:50 PM  Negative    Negative   01/06/2022 12:37 PM Positive  Negative   12/06/2021  1:55 PM  Negative   11/23/2021 10:51 AM  Negative   09/25/2021 11:19 AM Negative  Negative   08/26/2021  1:37 PM Negative  Negative   10/29/2020  6:26 PM Negative  Negative     Hepatitis B Lab Results  Component Value Date   HEPBSAB REACTIVE (A) 07/11/2022   HEPBSAG NON REACTIVE 09/03/2022  Hepatitis C Lab Results  Component Value Date   HEPCAB NON-REACTIVE 02/24/2022   Hepatitis A Lab Results  Component Value Date   HAV NON-REACTIVE 01/27/2022   Lipids: No results found for: "CHOL", "TRIG", "HDL", "CHOLHDL", "VLDL", "LDLCALC"  TARGET DATE: The 30th of the month  Assessment: Christy Thomas presents today for her Apretude injection and to follow up for HIV PrEP. No issues with past injections. Denies any symptoms of acute HIV. Last STI screening was on 05/09/23 and was negative. No known exposures to any STIs since last visit. Christy Thomas agrees to full STI testing today with RPR and oral/urine cytologies. Christy Thomas will be moving next month and is planning to travel the country in an RV with her Fiance. She will be stopping Apretude after this dose.  Explained to her that Apretude will be in her system for up to 12 weeks, and this will not protect her from HIV. If she does contract HIV during that time, it may become resistant to this class of medications. She understands this.  Per [employer] guidelines, a rapid HIV test should be drawn prior to Apretude administration. Due to state shortage of rapid HIV tests, this is temporarily unable to be done. Per decision from [facility] physicians, we will proceed with Apretude administration at this time without a negative rapid HIV test beforehand. HIV RNA was collected today and is in process.  Administered cabotegravir 600mg /5mL in right upper outer quadrant of the gluteal muscle.  Plan: - Apretude injection administered - HIV RNA today - STI testing: Oral/urine cytologies for gonorrhea/chlamydia, and RPR today - Follow up with [place] with any new questions or concerns, or needs to restart PrEP  Lora Paula, PharmD PGY-2 Infectious Diseases Pharmacy Resident [facility] for Infectious Disease

## 2023-06-05 ENCOUNTER — Ambulatory Visit (INDEPENDENT_AMBULATORY_CARE_PROVIDER_SITE_OTHER): Payer: Self-pay | Admitting: Pharmacist

## 2023-06-05 ENCOUNTER — Other Ambulatory Visit: Payer: Self-pay

## 2023-06-05 DIAGNOSIS — Z113 Encounter for screening for infections with a predominantly sexual mode of transmission: Secondary | ICD-10-CM

## 2023-06-05 DIAGNOSIS — Z2981 Encounter for HIV pre-exposure prophylaxis: Secondary | ICD-10-CM

## 2023-06-05 MED ORDER — CABOTEGRAVIR ER 600 MG/3ML IM SUER
600.0000 mg | Freq: Once | INTRAMUSCULAR | Status: AC
  Administered 2023-06-05: 600 mg via INTRAMUSCULAR

## 2023-06-06 LAB — URINE CYTOLOGY ANCILLARY ONLY
Chlamydia: NEGATIVE
Comment: NEGATIVE
Comment: NORMAL
Neisseria Gonorrhea: NEGATIVE

## 2023-06-06 LAB — CYTOLOGY, (ORAL, ANAL, URETHRAL) ANCILLARY ONLY
Chlamydia: NEGATIVE
Comment: NEGATIVE
Comment: NORMAL
Neisseria Gonorrhea: NEGATIVE

## 2023-06-07 LAB — HIV-1 RNA QUANT-NO REFLEX-BLD
HIV 1 RNA Quant: NOT DETECTED {copies}/mL
HIV-1 RNA Quant, Log: NOT DETECTED {Log}

## 2023-06-07 LAB — RPR: RPR Ser Ql: NONREACTIVE

## 2023-07-18 ENCOUNTER — Other Ambulatory Visit (HOSPITAL_COMMUNITY): Payer: Self-pay

## 2023-07-23 ENCOUNTER — Telehealth: Payer: Self-pay

## 2023-07-23 NOTE — Telephone Encounter (Addendum)
 [facility] Patient Advocate Encounter  Patient's medications APRETUDE  have been couriered to [facility] from [facility] [facility] and will be administered at the patients appointment on       .  Came 07/20/23  Charmaine Sharps, CPhT Specialty Pharmacy Patient [facility] for Infectious Disease Phone: [phone] Fax:  [phone]

## 2023-07-26 ENCOUNTER — Ambulatory Visit: Payer: Self-pay | Admitting: Pharmacist

## 2023-07-26 ENCOUNTER — Other Ambulatory Visit (HOSPITAL_COMMUNITY)
Admission: RE | Admit: 2023-07-26 | Discharge: 2023-07-26 | Disposition: A | Discharge status: Home / Self Care | Payer: Self-pay | Source: Ambulatory Visit | Attending: Infectious Disease | Admitting: Infectious Disease

## 2023-07-26 ENCOUNTER — Other Ambulatory Visit: Payer: Self-pay

## 2023-07-26 DIAGNOSIS — Z113 Encounter for screening for infections with a predominantly sexual mode of transmission: Secondary | ICD-10-CM

## 2023-07-26 NOTE — Progress Notes (Signed)
   07/26/2023  HPI: Christy Thomas is a 28 y.o. female who presents to the [facility] clinic today for STI testing.  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 11/27/2021   Recurrent tonsillitis 11/23/2021   Acute pharyngitis 11/23/2021   Anxiety 11/23/2021   Bipolar 2 disorder, major depressive episode (HCC) 11/23/2021   Panic attacks 11/23/2021   Lymphadenopathy 11/23/2021   Anemia 11/23/2021   ESBL (extended spectrum beta-lactamase) producing bacteria infection 10/18/2021   PICC (peripherally inserted central catheter) in place 10/18/2021   Normocytic anemia 09/30/2021   Acute pyelonephritis 09/28/2021   Hypokalemia 09/28/2021   Urinary tract infection due to extended-spectrum beta lactamase (ESBL) producing Escherichia coli 09/28/2021    Patient's Medications  New Prescriptions   No medications on file  Previous Medications   IBUPROFEN (ADVIL) 200 MG TABLET    Take 400 mg by mouth every 6 (six) hours as needed for headache (pain).   LEVONORGESTREL (KYLEENA) 19.5 MG IUD    Provided by care center   MULTIPLE VITAMIN (MULTIVITAMIN WITH MINERALS) TABS TABLET    Take 1 tablet by mouth every morning.   TRIAMCINOLONE CREAM (KENALOG) 0.1 %    Apply 1 Application topically 2 (two) times daily.   VALACYCLOVIR (VALTREX) 500 MG TABLET    Take 500 mg by mouth every morning.  Modified Medications   No medications on file  Discontinued Medications   No medications on file    Assessment: Christy Thomas presents to [facility] pharmacy clinic today requesting STI testing. Patient reports no s/sx of STI. Patient was screened and negative for s/sx of HIV infection (e.g., fever, chills, rash, flu-like symptoms). Patient reports having approximately 5 new sexual partners since last visit on 06/05/2023. They report using condoms with sexual intercourse.  Patient requests RPR and oral/urine cytologies to test for syphilis, chlamydia, and gonorrhea. Patient also requests a test for HIV. Will test for HIV RNA today.   PMH: Patient was on Apretude PrEP with [facility] clinic. Last seen 06/05/23. On last visit, patient was negative for syphilis, gonorrhea and chlamydia with RPR and urine/rectal/pharyngeal cytologies. Patient's HIV RNA was also negative from last visit. Patient reports she will be moving tomorrow and will be travelling the country on an RV with her fiance. She will no longer follow with [facility] pharmacy clinic with respect to her PrEP. Patient reports she will not need PrEP after her move tomorrow.   Plan: - STI screening: RPR, urine/pharyngeal GC/CT swabs for cytology today - HIV RNA today to test for HIV infection - F/u results to see if treatment is needed  Christy Thomas V. Edwena Blow, PharmD Candidate [employer] [facility] [employer] 07/26/2023, 1:32 PM

## 2023-07-27 LAB — CYTOLOGY, (ORAL, ANAL, URETHRAL) ANCILLARY ONLY
Chlamydia: NEGATIVE
Comment: NEGATIVE
Comment: NORMAL
Neisseria Gonorrhea: NEGATIVE

## 2023-07-27 LAB — URINE CYTOLOGY ANCILLARY ONLY
Chlamydia: NEGATIVE
Comment: NEGATIVE
Comment: NORMAL
Neisseria Gonorrhea: NEGATIVE

## 2023-07-29 LAB — RPR: RPR Ser Ql: NONREACTIVE

## 2023-07-29 LAB — HIV-1 RNA QUANT-NO REFLEX-BLD
HIV 1 RNA Quant: NOT DETECTED {copies}/mL
HIV-1 RNA Quant, Log: NOT DETECTED {Log}

## 2023-12-10 ENCOUNTER — Other Ambulatory Visit (HOSPITAL_COMMUNITY): Payer: Self-pay

## 2023-12-11 NOTE — Progress Notes (Unsigned)
   HPI: Christy Thomas is a 28 y.o. female who presents to the [facility] clinic today for STI testing.  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 11/27/2021   Recurrent tonsillitis 11/23/2021   Acute pharyngitis 11/23/2021   Anxiety 11/23/2021   Bipolar 2 disorder, major depressive episode (HCC) 11/23/2021   Panic attacks 11/23/2021   Lymphadenopathy 11/23/2021   Anemia 11/23/2021   ESBL (extended spectrum beta-lactamase) producing bacteria infection 10/18/2021   PICC (peripherally inserted central catheter) in place 10/18/2021   Normocytic anemia 09/30/2021   Acute pyelonephritis 09/28/2021   Hypokalemia 09/28/2021   Urinary tract infection due to extended-spectrum beta lactamase (ESBL) producing Escherichia coli 09/28/2021    Patient's Medications  New Prescriptions   No medications on file  Previous Medications   IBUPROFEN  (ADVIL ) 200 MG TABLET    Take 400 mg by mouth every 6 (six) hours as needed for headache (pain).   LEVONORGESTREL (KYLEENA) 19.5 MG IUD    Provided by care center   MULTIPLE VITAMIN (MULTIVITAMIN WITH MINERALS) TABS TABLET    Take 1 tablet by mouth every morning.   TRIAMCINOLONE  CREAM (KENALOG ) 0.1 %    Apply 1 Application topically 2 (two) times daily.   VALACYCLOVIR (VALTREX) 500 MG TABLET    Take 500 mg by mouth every morning.  Modified Medications   No medications on file  Discontinued Medications   No medications on file   Assessment: [name] presents today for routine STI screening. She was previously on Apretude  for PrEP against HIV, but after discussion of continued risk, and changes in lifestyle, she has stopped. She denies STI symptoms, including HIV. She denies recent known exposures.  In discussion today, Christy Thomas is back in [place]. She requests again routine testing every 2 months. She  reports that she is no longer monogamous, and has had ~10 sexual partners in the last 3 months. 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 Discussed with her possible re-initiation of PrEP against HIV. She declines. She is aware that Apretude  is with an extended half-life, with high risk of resistance development if she were to acquire HIV during this time period. Given that she is uninsured, connected her with Deanna for help with possible [employer].  Plan: - STI screening: HIV antibody, RPR, urine/rectal/pharyngeal GC/CT swabs for cytology today - F/u results to see if treatment is needed - F/u insurance updates at next visit - Scheduled 2 month follow up with Mylinda Asa on 02/12/24  Estela Held, PharmD PGY-2 Infectious Diseases Pharmacy Resident [facility] 12/12/2023 2:36 PM

## 2023-12-12 ENCOUNTER — Ambulatory Visit: Payer: Self-pay | Admitting: Pharmacist

## 2023-12-12 ENCOUNTER — Other Ambulatory Visit: Payer: Self-pay

## 2023-12-12 DIAGNOSIS — Z113 Encounter for screening for infections with a predominantly sexual mode of transmission: Secondary | ICD-10-CM

## 2023-12-13 LAB — CYTOLOGY, (ORAL, ANAL, URETHRAL) ANCILLARY ONLY
Chlamydia: NEGATIVE
Comment: NEGATIVE
Comment: NORMAL
Neisseria Gonorrhea: NEGATIVE

## 2023-12-13 LAB — URINE CYTOLOGY ANCILLARY ONLY
Chlamydia: NEGATIVE
Comment: NEGATIVE
Comment: NORMAL
Neisseria Gonorrhea: NEGATIVE

## 2023-12-13 LAB — HIV ANTIBODY (ROUTINE TESTING W REFLEX): HIV 1&2 Ab, 4th Generation: NONREACTIVE

## 2023-12-13 LAB — RPR: RPR Ser Ql: NONREACTIVE

## 2024-02-01 ENCOUNTER — Other Ambulatory Visit (HOSPITAL_COMMUNITY): Payer: Self-pay

## 2024-02-08 NOTE — Progress Notes (Unsigned)
 02/08/2024  HPI: Christy Thomas is a 28 y.o. female who presents to the [facility] clinic today for STI testing.  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 11/27/2021   Recurrent tonsillitis 11/23/2021   Acute pharyngitis 11/23/2021   Anxiety 11/23/2021   Bipolar 2 disorder, major depressive episode (HCC) 11/23/2021   Panic attacks 11/23/2021   Lymphadenopathy 11/23/2021   Anemia 11/23/2021   ESBL (extended spectrum beta-lactamase) producing bacteria infection 10/18/2021   PICC (peripherally inserted central catheter) in place 10/18/2021   Normocytic anemia 09/30/2021   Acute pyelonephritis 09/28/2021   Hypokalemia 09/28/2021   Urinary tract infection due to extended-spectrum beta lactamase (ESBL) producing Escherichia coli 09/28/2021    Patient's Medications  New Prescriptions   No medications on file  Previous Medications   IBUPROFEN  (ADVIL ) 200 MG TABLET    Take 400 mg by mouth every 6 (six) hours as needed for headache (pain).   LEVONORGESTREL (KYLEENA) 19.5 MG IUD    Provided by care center   MULTIPLE VITAMIN (MULTIVITAMIN WITH MINERALS) TABS TABLET    Take 1 tablet by mouth every morning.   TRIAMCINOLONE  CREAM (KENALOG ) 0.1 %    Apply 1 Application topically 2 (two) times daily.   VALACYCLOVIR (VALTREX) 500 MG TABLET    Take 500 mg by mouth every morning.  Modified Medications   No medications on file  Discontinued Medications   No medications on file    Allergies: No Known Allergies  Past Medical History: Past Medical History:  Diagnosis Date   Gonorrhea 11/27/2021   UTI (urinary tract infection)     Social History: Social History   Socioeconomic History   Marital status: Single    Spouse name: Not on file   Number of children: 0   Years of education: Not on file   Highest education level:  Not on file  Occupational History   Not on file  Tobacco Use   Smoking status: Never   Smokeless tobacco: Never  Vaping Use   Vaping status: Never Used  Substance and Sexual Activity   Alcohol use: Not Currently    Comment: occasionally   Drug use: Not Currently    Types: Marijuana   Sexual activity: Not on file  Other Topics Concern   Not on file  Social History Narrative   Not on file   Social Drivers of Health   Financial Resource Strain: Not on file  Food Insecurity: Not on file  Transportation Needs: Not on file  Physical Activity: Not on file  Stress: Not on file  Social Connections: Not on file     Assessment: [name] presents to clinic today for STI screening. 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 Continues to decline Apretude  reinitiation; will check HIV RNA testing given lower Apretude  levels at this time. Will check routine STI testing today.  States she developed a non-pruritic rash ~2 weeks ago around her trunk area that eventually spread to her arms and neck. She tried head and shoulders shampoo which helped reduce the redness and the rash. Will reach out if this worsens. Could be related to syphilis and will treat appropriately based on lab results.   Unfortunately, [facility] is pulling the funding for our uninsured HIV PrEP and STI program. Advised that she must  either sign up for insurance through the marketplace, sign up for Medicaid if eligible, or apply for [facility] charity care (financial assistance). Discussed that the main costs would be the $65 office visit copay and the lab work. [facility] does provide financial assistance for uninsured patients for most lab work (HIV tests and RPR) but that the cytologies (STI testing) would need to be covered through the [facility] charity care. PrEP medications (both oral and injectable) would be covered through patient assistance through the drug manufacturer still or  through their insurance if they decide to sign up. She is over income for Medicaid. Will plan to seek further STI testing through different community organizations such as [facility] or the health department.  Plan: - STI screening: HIV RNA, RPR, urine/pharyngeal GC/CT swabs for cytology today - Follow up with with community STI services   Christy Thomas, PharmD, CPP, BCIDP, AAHIVP Clinical Pharmacist Practitioner Infectious Diseases Clinical Pharmacist [facility]

## 2024-02-12 ENCOUNTER — Other Ambulatory Visit: Payer: Self-pay

## 2024-02-12 ENCOUNTER — Ambulatory Visit: Payer: Self-pay | Admitting: Pharmacist

## 2024-02-12 DIAGNOSIS — R21 Rash and other nonspecific skin eruption: Secondary | ICD-10-CM

## 2024-02-12 DIAGNOSIS — Z113 Encounter for screening for infections with a predominantly sexual mode of transmission: Secondary | ICD-10-CM

## 2024-02-13 LAB — CYTOLOGY, (ORAL, ANAL, URETHRAL) ANCILLARY ONLY
Chlamydia: NEGATIVE
Comment: NEGATIVE
Comment: NORMAL
Neisseria Gonorrhea: NEGATIVE

## 2024-02-13 LAB — URINE CYTOLOGY ANCILLARY ONLY
Chlamydia: NEGATIVE
Comment: NEGATIVE
Comment: NORMAL
Neisseria Gonorrhea: NEGATIVE

## 2024-02-14 LAB — HIV-1 RNA QUANT-NO REFLEX-BLD
HIV 1 RNA Quant: NOT DETECTED {copies}/mL
HIV-1 RNA Quant, Log: NOT DETECTED {Log_copies}/mL

## 2024-02-14 LAB — RPR: RPR Ser Ql: NONREACTIVE

## 2024-02-21 IMAGING — CT CT RENAL STONE PROTOCOL
2 of 4 series · 16 of 46 positions shown, 18 images · non-contrast
Comparison: None.

CLINICAL DATA: Acute right flank pain.



[Series 3: renal stone 5.0 · axial · 0.68mm/px · z∈[+636,+1041]mm · 13 of 89 slices shown, 15 images]
[im 4/89  soft-tissue]
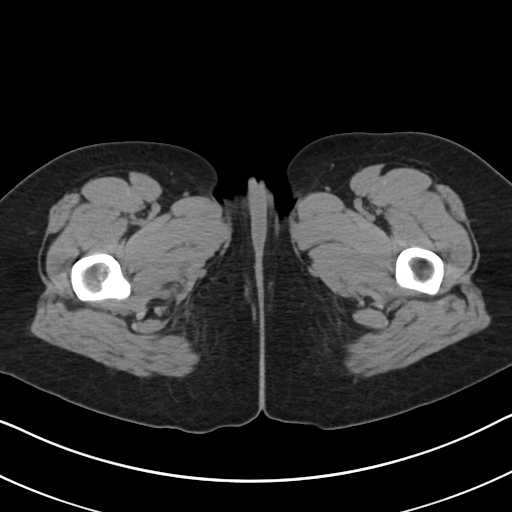
[im 4/89  bone]
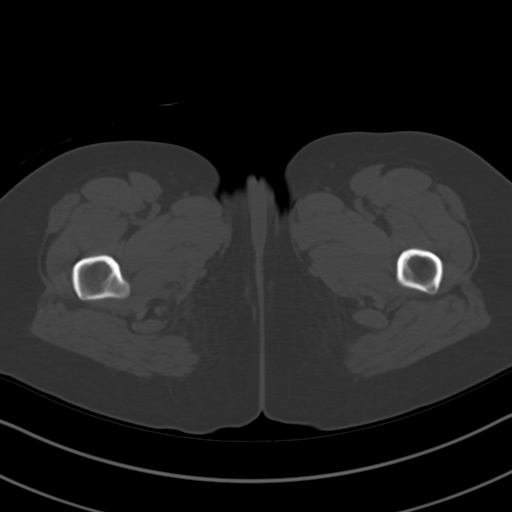
[im 11/89  soft-tissue]
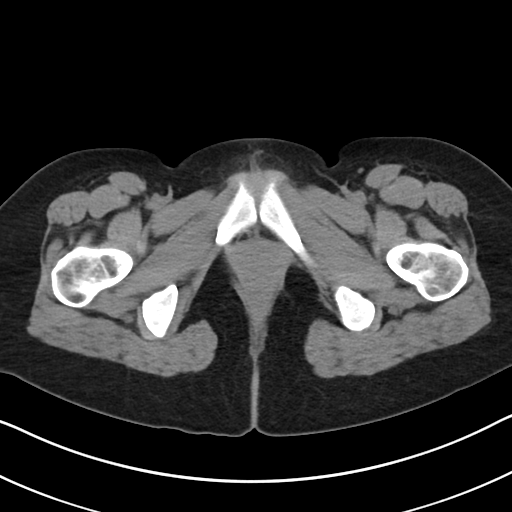
[im 18/89  soft-tissue]
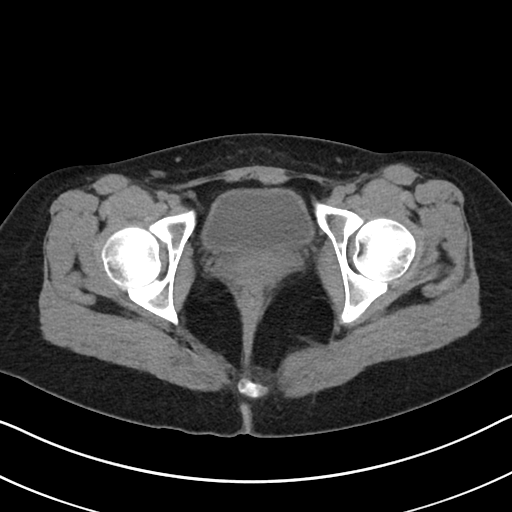
[im 25/89  soft-tissue]
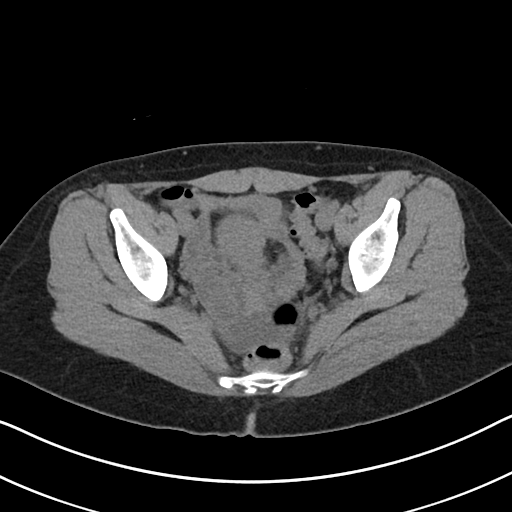
[im 32/89  soft-tissue]
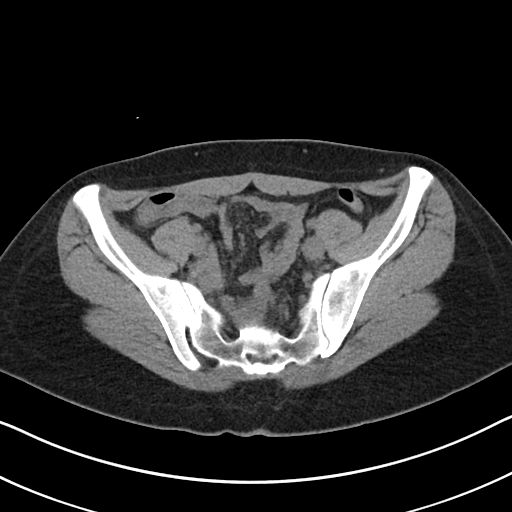
[im 39/89  soft-tissue]
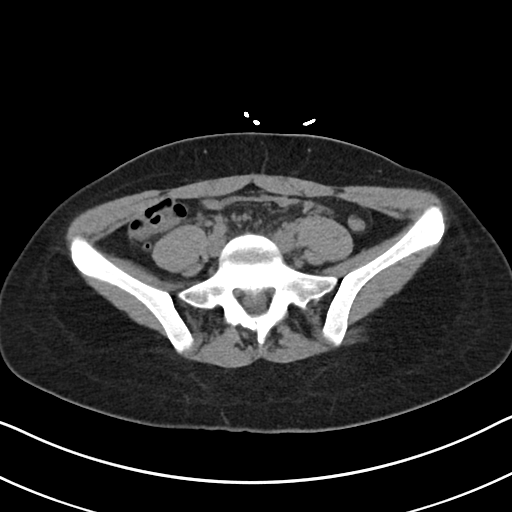
[im 46/89  soft-tissue]
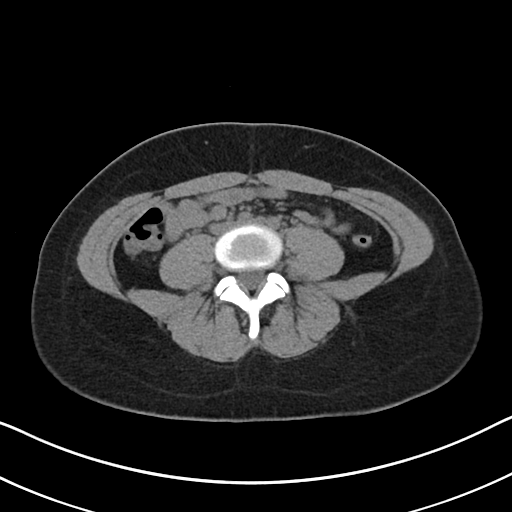
[im 50/89  soft-tissue]
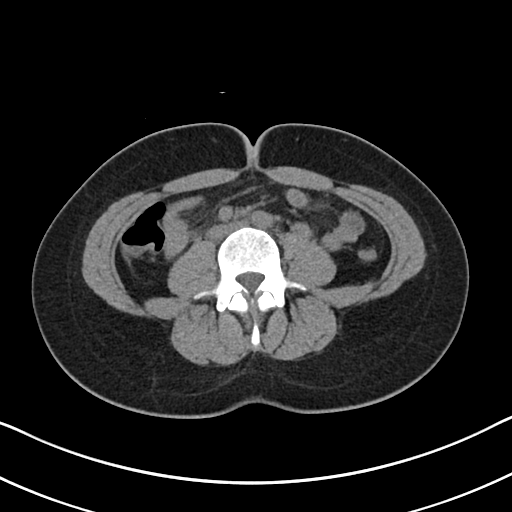
[im 57/89  soft-tissue]
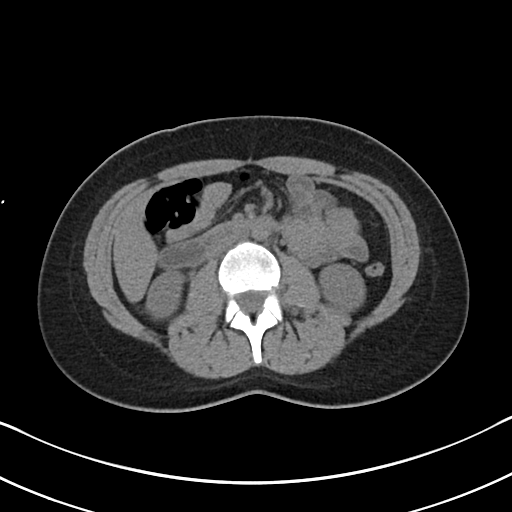
[im 57/89  bone]
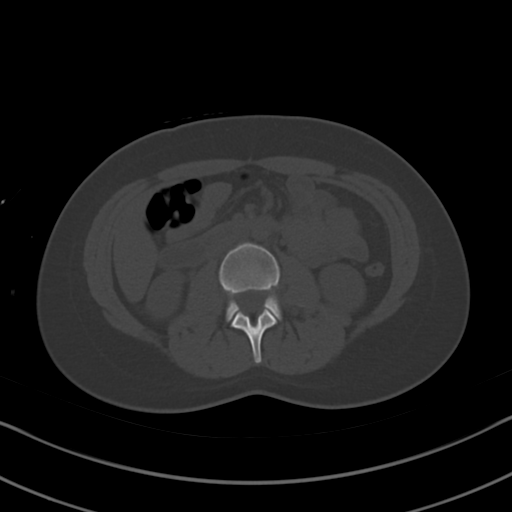
[im 64/89  soft-tissue]
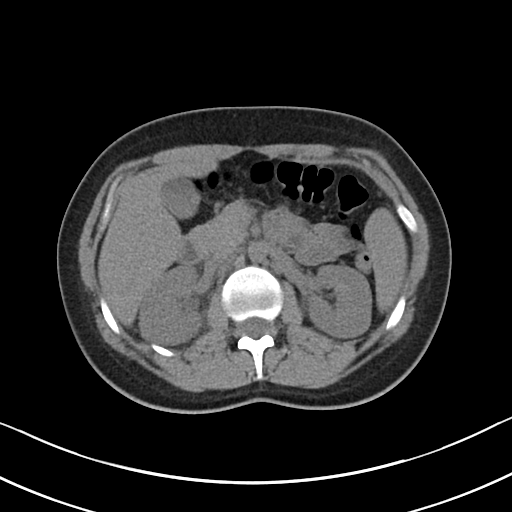
[im 71/89  soft-tissue]
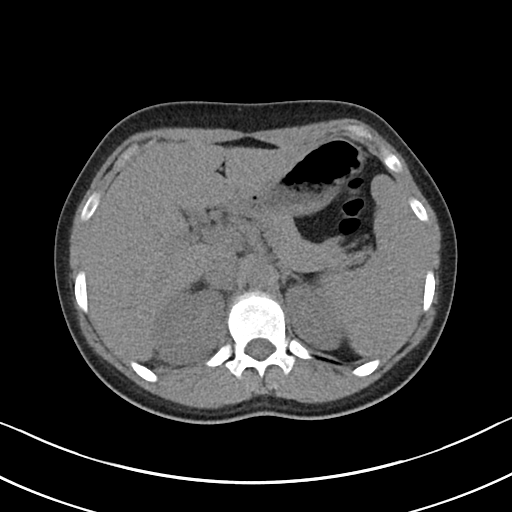
[im 78/89  soft-tissue]
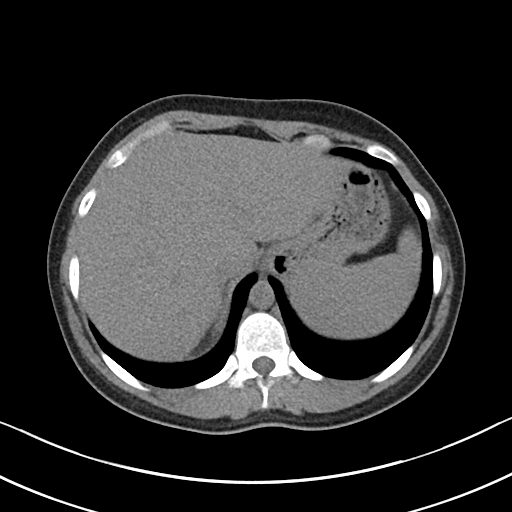
[im 85/89  soft-tissue]
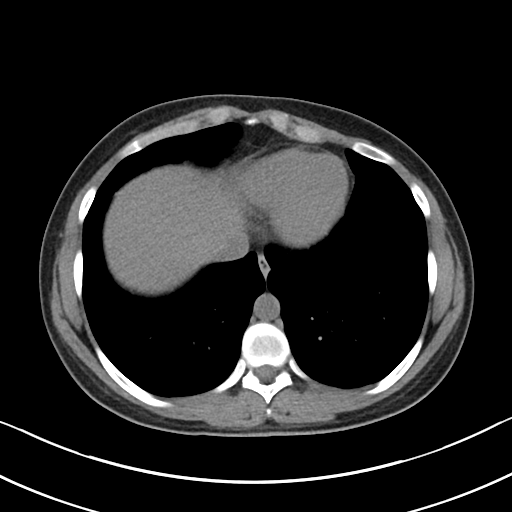

[Series 6: cor · coronal · 0.67mm/px · 3 of 115 slices shown]
[im 39/115  soft-tissue]
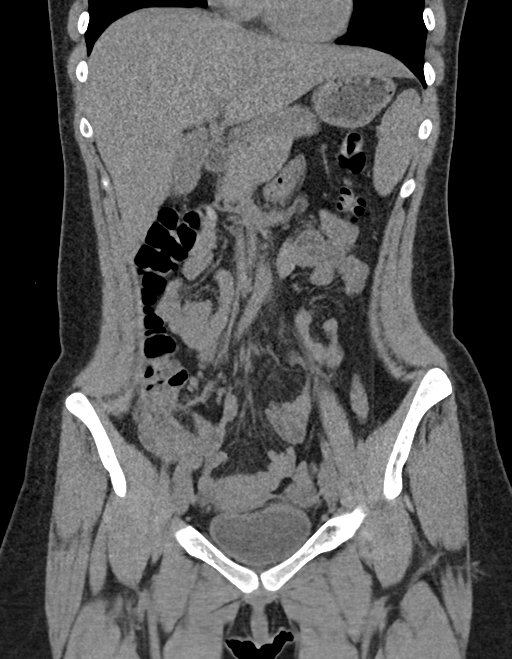
[im 51/115  soft-tissue]
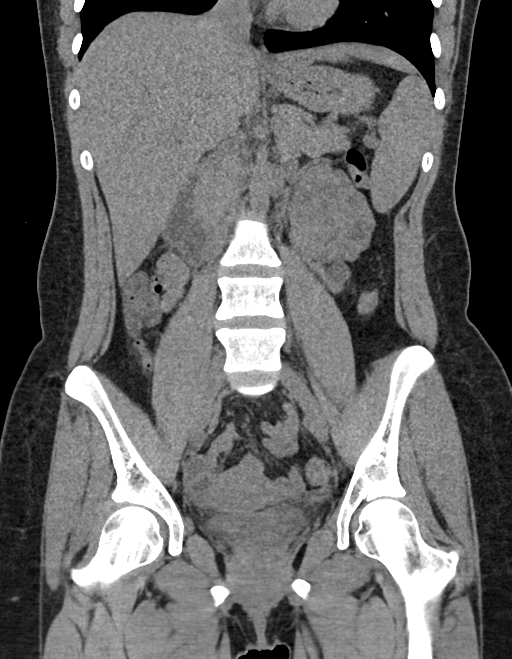
[im 64/115  soft-tissue]
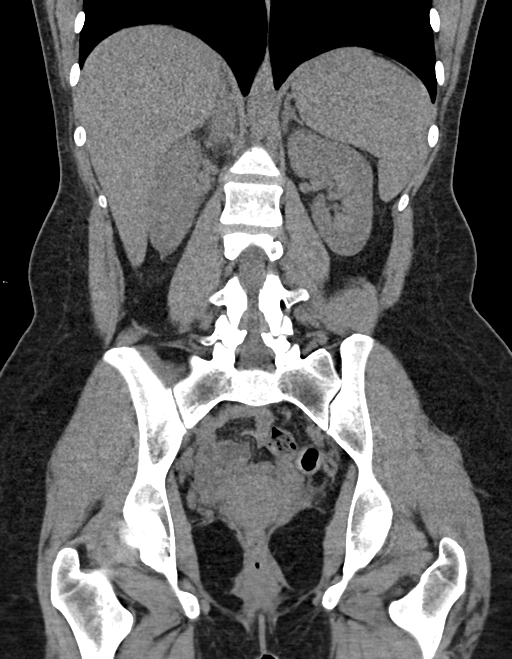

[16 of 46 positions shown; findings below may reference images not displayed]

FINDINGS: Lower chest: No acute abnormality.

Hepatobiliary: No focal liver abnormality is seen. No gallstones,
gallbladder wall thickening, or biliary dilatation.

Pancreas: Unremarkable. No pancreatic ductal dilatation or
surrounding inflammatory changes.

Spleen: Normal in size without focal abnormality.

Adrenals/Urinary Tract: Adrenal glands are unremarkable. No renal or
ureteral calculi are noted. No hydronephrosis or renal obstruction
is noted. Urinary bladder is unremarkable. Mild inflammatory changes
are noted around the right kidney concerning for possible
pyelonephritis.

Stomach/Bowel: Stomach is within normal limits. Appendix appears
normal. No evidence of bowel wall thickening, distention, or
inflammatory changes.

Vascular/Lymphatic: No significant vascular findings are present. No
enlarged abdominal or pelvic lymph nodes.

Reproductive: Uterus and bilateral adnexa are unremarkable.

Other: No hernia is noted. Small amount of free fluid is noted in
the pelvis which may be physiologic.

Musculoskeletal: No acute or significant osseous findings.
IMPRESSION: Findings concerning for right pyelonephritis.

No hydronephrosis or renal obstruction is noted.

## 2024-02-22 IMAGING — DX DG CHEST 1V PORT
1 series · 1 of 1 positions shown · non-contrast
Comparison: None.

CLINICAL DATA: Question sepsis.  Fevers and chills.

EXAM:
PORTABLE CHEST 1 VIEW

[chest]
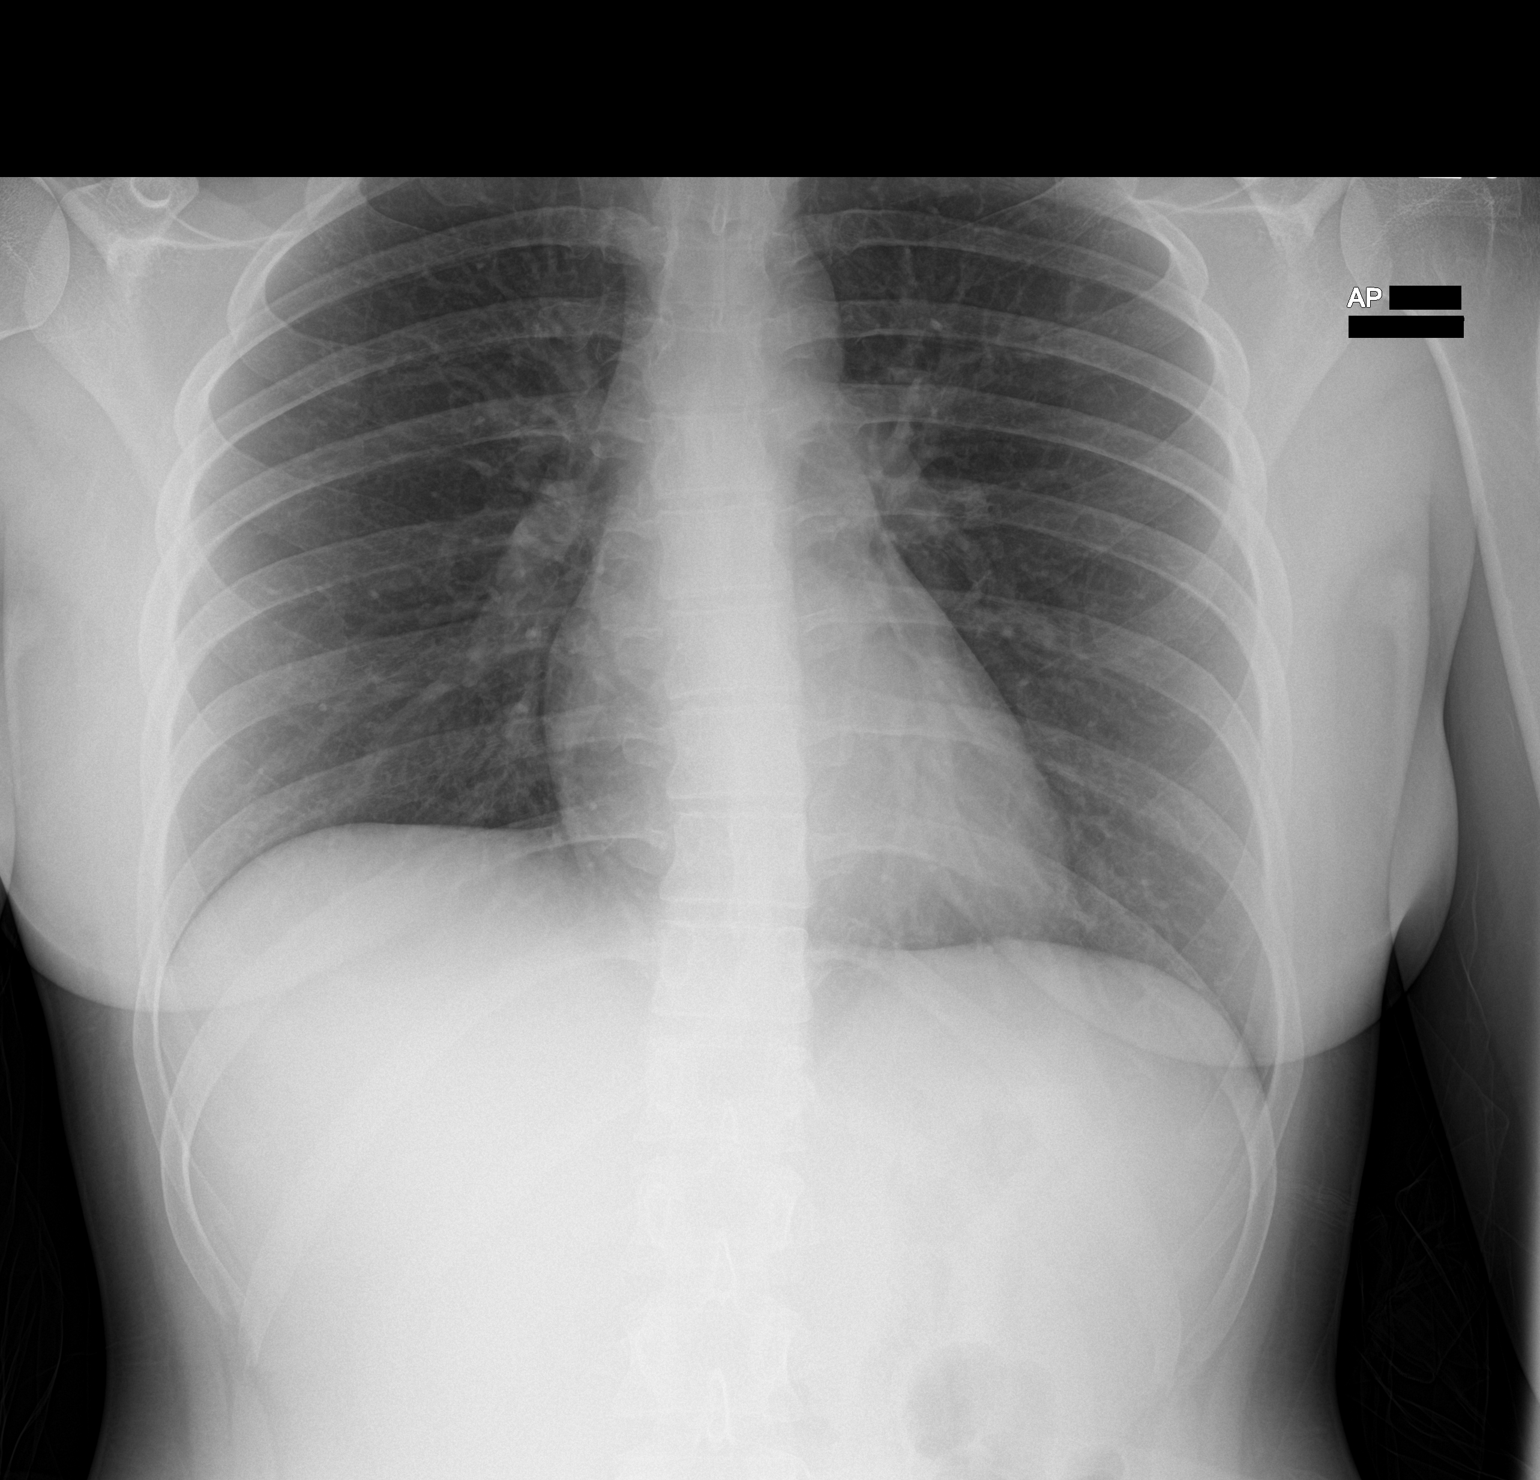

[1 of 1 positions shown; findings below may reference images not displayed]

FINDINGS: The heart size and mediastinal contours are within normal limits.
Both lungs are clear. The visualized skeletal structures are
unremarkable.
IMPRESSION: No active disease.

## 2024-02-25 IMAGING — CR DG ABDOMEN ACUTE W/ 1V CHEST
3 series · 3 of 3 positions shown · non-contrast
Comparison: CT abdomen pelvis dated 09/27/2021.

CLINICAL DATA: Difficulty breathing, bloating.

EXAM:
DG ABDOMEN ACUTE WITH 1 VIEW CHEST

[chest pa]
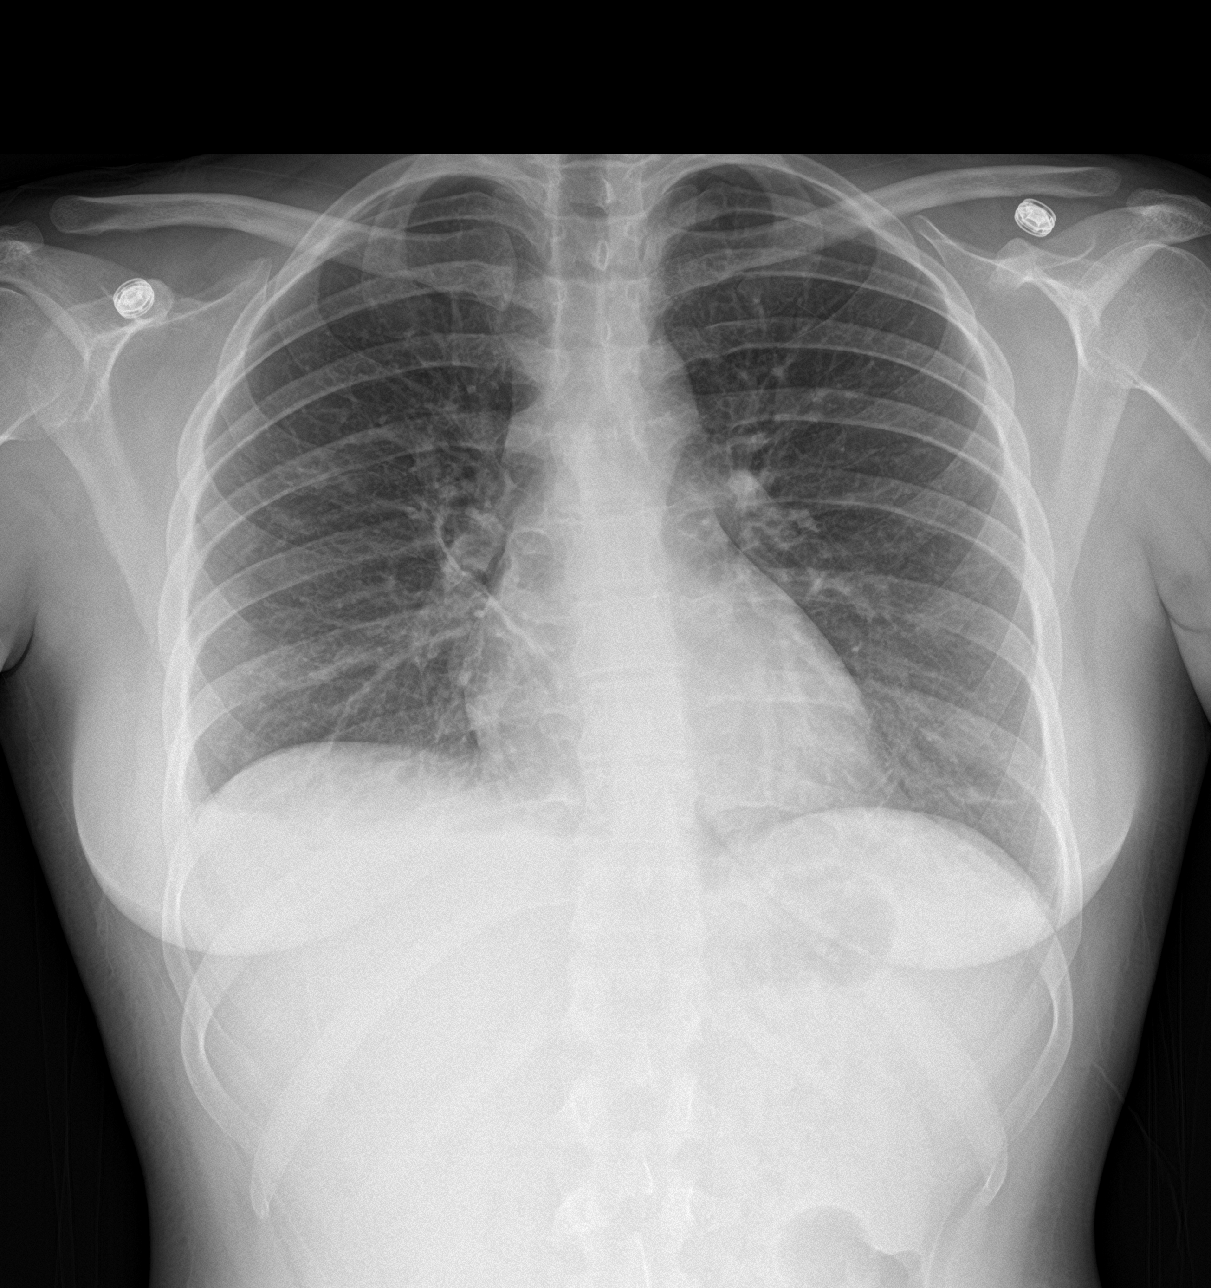

[abdomen erect]
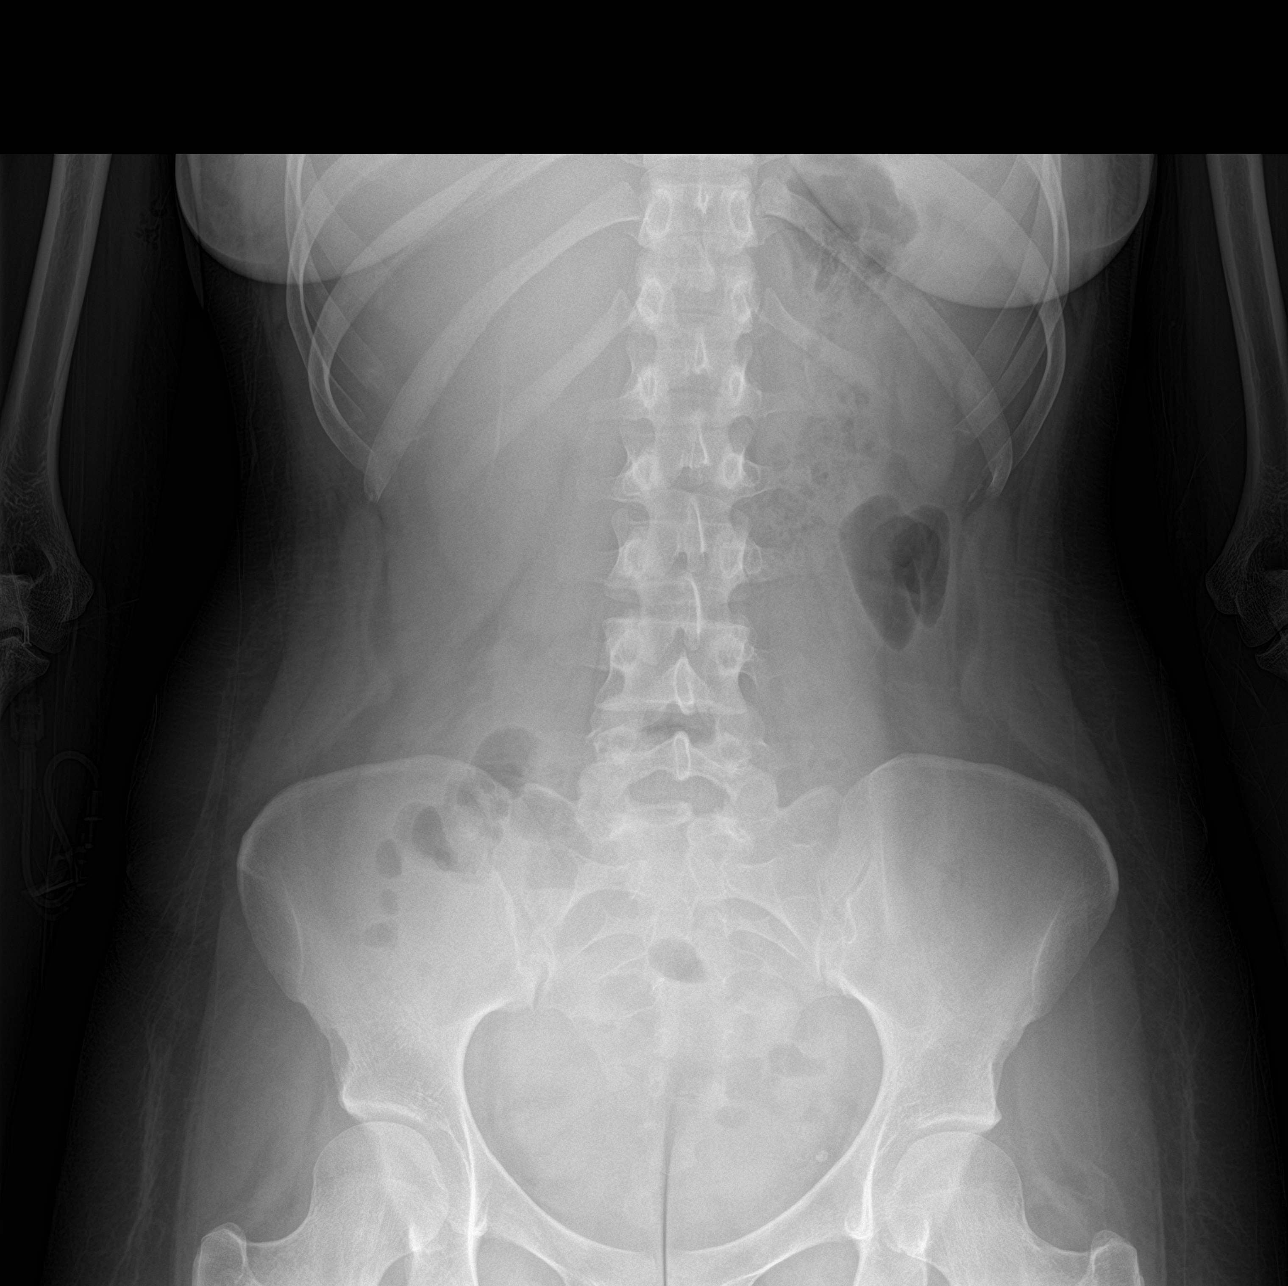

[abdomen supine]
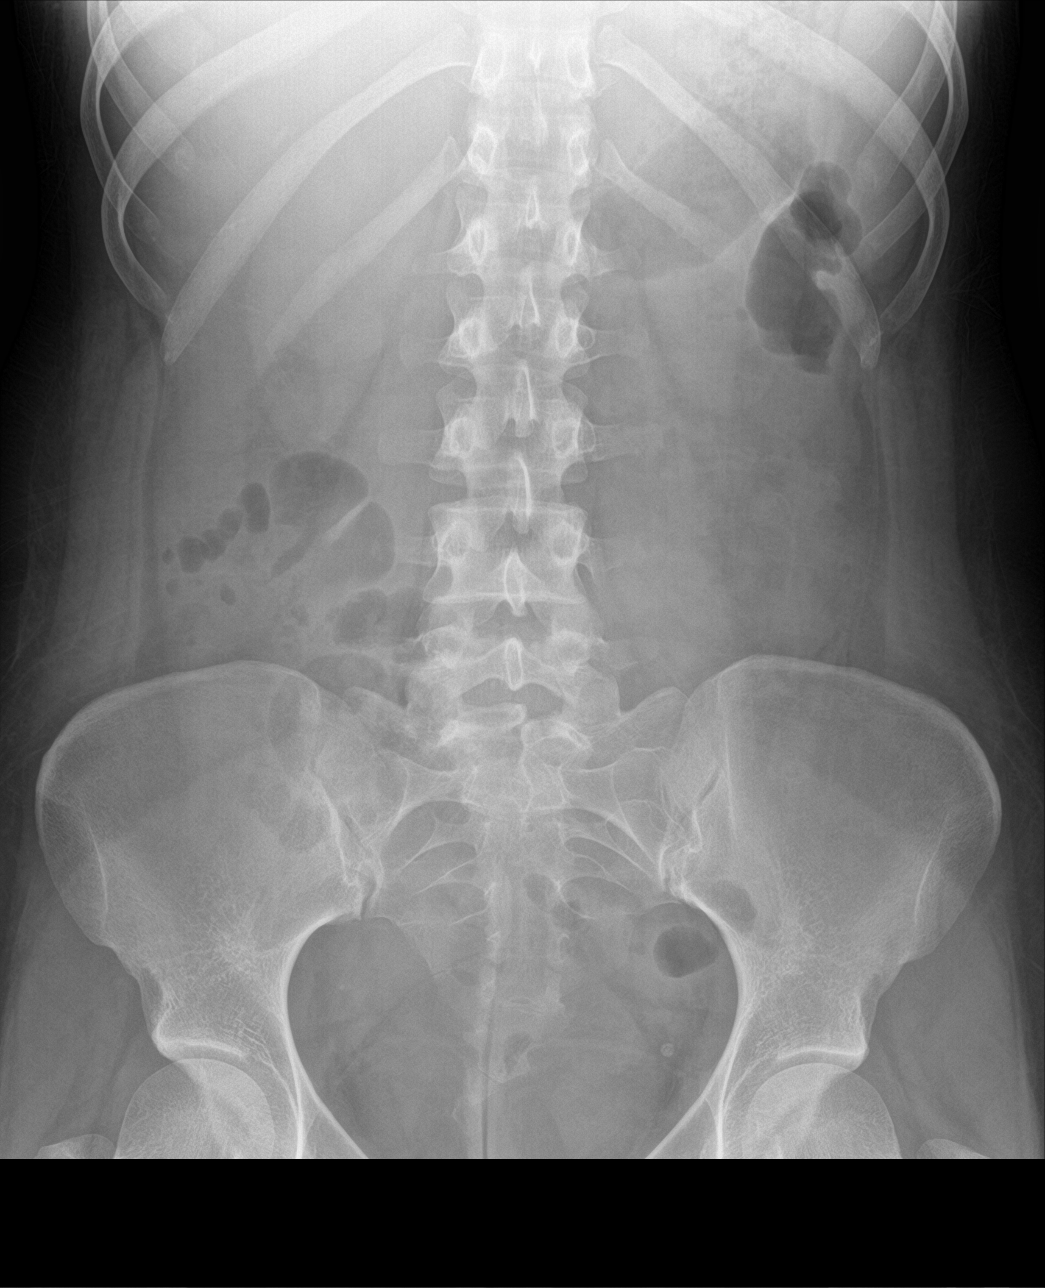

[3 of 3 positions shown; findings below may reference images not displayed]

FINDINGS: There is no evidence of dilated bowel loops or free intraperitoneal
air. No radiopaque calculi or other significant radiographic
abnormality is seen. Heart size and mediastinal contours are within
normal limits. Both lungs are clear.
IMPRESSION: Negative abdominal radiographs.  No acute cardiopulmonary disease.
